# Patient Record
Sex: Female | Born: 1965 | Race: Black or African American | Hispanic: No | Marital: Married | Smoking: Never smoker
Health system: Southern US, Community
[De-identification: ages and names within clinical notes are randomized; demographics above are authoritative.]

## PROBLEM LIST (undated history)

## (undated) DIAGNOSIS — M1990 Unspecified osteoarthritis, unspecified site: Secondary | ICD-10-CM

## (undated) DIAGNOSIS — T7840XA Allergy, unspecified, initial encounter: Secondary | ICD-10-CM

## (undated) DIAGNOSIS — L309 Dermatitis, unspecified: Secondary | ICD-10-CM

## (undated) HISTORY — DX: Unspecified osteoarthritis, unspecified site: M19.90

## (undated) HISTORY — DX: Dermatitis, unspecified: L30.9

## (undated) HISTORY — DX: Allergy, unspecified, initial encounter: T78.40XA

## (undated) HISTORY — PX: TUBAL LIGATION: SHX77

## (undated) HISTORY — PX: KIDNEY DONATION: SHX685

## (undated) HISTORY — PX: ENDOMETRIAL ABLATION: SHX621

---

## 2001-05-18 ENCOUNTER — Other Ambulatory Visit: Admission: RE | Admit: 2001-05-18 | Discharge: 2001-05-18 | Payer: Self-pay | Admitting: *Deleted

## 2001-06-09 ENCOUNTER — Other Ambulatory Visit: Admission: RE | Admit: 2001-06-09 | Discharge: 2001-06-09 | Payer: Self-pay | Admitting: *Deleted

## 2001-06-09 ENCOUNTER — Encounter (INDEPENDENT_AMBULATORY_CARE_PROVIDER_SITE_OTHER): Payer: Self-pay | Admitting: Specialist

## 2001-08-09 ENCOUNTER — Observation Stay (HOSPITAL_COMMUNITY): Admission: RE | Admit: 2001-08-09 | Discharge: 2001-08-10 | Payer: Self-pay | Admitting: *Deleted

## 2001-08-09 ENCOUNTER — Encounter (INDEPENDENT_AMBULATORY_CARE_PROVIDER_SITE_OTHER): Payer: Self-pay

## 2002-05-15 ENCOUNTER — Encounter: Admission: RE | Admit: 2002-05-15 | Discharge: 2002-05-15 | Payer: Self-pay | Admitting: Family Medicine

## 2002-05-15 ENCOUNTER — Encounter: Payer: Self-pay | Admitting: Family Medicine

## 2002-05-24 ENCOUNTER — Other Ambulatory Visit: Admission: RE | Admit: 2002-05-24 | Discharge: 2002-05-24 | Payer: Self-pay | Admitting: *Deleted

## 2004-11-05 ENCOUNTER — Inpatient Hospital Stay (HOSPITAL_COMMUNITY): Admission: AD | Admit: 2004-11-05 | Discharge: 2004-11-05 | Payer: Self-pay | Admitting: Obstetrics & Gynecology

## 2004-11-09 ENCOUNTER — Inpatient Hospital Stay (HOSPITAL_COMMUNITY): Admission: AD | Admit: 2004-11-09 | Discharge: 2004-11-09 | Payer: Self-pay | Admitting: Obstetrics and Gynecology

## 2004-11-11 ENCOUNTER — Inpatient Hospital Stay (HOSPITAL_COMMUNITY): Admission: AD | Admit: 2004-11-11 | Discharge: 2004-11-11 | Payer: Self-pay | Admitting: Obstetrics & Gynecology

## 2004-11-18 ENCOUNTER — Inpatient Hospital Stay (HOSPITAL_COMMUNITY): Admission: AD | Admit: 2004-11-18 | Discharge: 2004-11-18 | Payer: Self-pay | Admitting: Obstetrics and Gynecology

## 2005-11-22 ENCOUNTER — Emergency Department (HOSPITAL_COMMUNITY): Admission: EM | Admit: 2005-11-22 | Discharge: 2005-11-22 | Payer: Self-pay | Admitting: Emergency Medicine

## 2005-12-29 ENCOUNTER — Ambulatory Visit: Payer: Self-pay | Admitting: Obstetrics & Gynecology

## 2006-01-12 ENCOUNTER — Encounter: Payer: Self-pay | Admitting: Obstetrics & Gynecology

## 2006-01-12 ENCOUNTER — Ambulatory Visit: Payer: Self-pay | Admitting: Family Medicine

## 2006-01-28 ENCOUNTER — Inpatient Hospital Stay (HOSPITAL_COMMUNITY): Admission: AD | Admit: 2006-01-28 | Discharge: 2006-01-28 | Payer: Self-pay | Admitting: Obstetrics & Gynecology

## 2006-02-17 ENCOUNTER — Ambulatory Visit: Payer: Self-pay | Admitting: Obstetrics & Gynecology

## 2006-02-22 ENCOUNTER — Ambulatory Visit (HOSPITAL_COMMUNITY): Admission: RE | Admit: 2006-02-22 | Discharge: 2006-02-22 | Payer: Self-pay | Admitting: Obstetrics and Gynecology

## 2006-03-03 ENCOUNTER — Ambulatory Visit: Payer: Self-pay | Admitting: Obstetrics and Gynecology

## 2006-05-02 ENCOUNTER — Ambulatory Visit (HOSPITAL_COMMUNITY): Admission: RE | Admit: 2006-05-02 | Discharge: 2006-05-02 | Payer: Self-pay | Admitting: Obstetrics and Gynecology

## 2006-05-02 ENCOUNTER — Ambulatory Visit: Payer: Self-pay | Admitting: Obstetrics and Gynecology

## 2006-05-25 ENCOUNTER — Ambulatory Visit: Payer: Self-pay | Admitting: Obstetrics & Gynecology

## 2006-05-26 ENCOUNTER — Ambulatory Visit (HOSPITAL_COMMUNITY): Admission: RE | Admit: 2006-05-26 | Discharge: 2006-05-26 | Payer: Self-pay | Admitting: Obstetrics & Gynecology

## 2008-07-11 ENCOUNTER — Emergency Department (HOSPITAL_COMMUNITY): Admission: EM | Admit: 2008-07-11 | Discharge: 2008-07-11 | Payer: Self-pay | Admitting: Emergency Medicine

## 2011-01-08 NOTE — H&P (Signed)
[facility]  Patient:    [name], REDE Visit Number: [id number] MRN: [id number]          Service Type: Attending:  Almedia Balls. Randell Patient, M.D. Dictated by:   Almedia Balls Randell Patient, M.D. Adm. Date:  08/09/01                           History and Physical  CHIEF COMPLAINT:              Fibroids, tubal obstruction by device.  HISTORY OF PRESENT ILLNESS:   The patient is a 45 year old gravida 1, para 1, whose last menstrual period was July 21, 2001.  She has been followed in our office since September 2002.  Because of the heavy flow that she had been experiencing, she underwent ultrasound with saline with finding of increased echogenic areas within the uterus and polypoid areas in the endometrium. She underwent hysteroscopy, D&C, laparoscopy on June 09, 2001, with findings of benign endocervical mucosa and benign endometrium with polyp present.  The laparoscopy revealed large pedunculated fibroids and normal proximal and distal lengths of tubes which had been previously coagulated for sterilization attempt. The patient now has remarried and desires reanastomosis and definitive therapy for her fibroids as well.  She is admitted at this time for exploratory laparotomy and myomectomy and microtubal reanastomosis.  She has been fully counseled as to the nature of this procedure and the risks involved to include risks of anastomosis, injury to uterus, tubes, ovaries, bowel, bladder, blood vessels, ureters, postoperative hemorrhage, infection, and recuperation. She fully understands all these considerations and wishes to proceed on August 09, 2001.  PAST MEDICAL HISTORY:         This includes the previous tubal ligation only as surgery.  She has heavy menstrual flow and some pain for which she takes ibuprofen with only slight relief.  FAMILY HISTORY:               This includes grandfather and mother with diabetes and great-grandmother with questionable breast  cancer.  ALLERGIES:                    No known drug allergies.  MEDICATIONS:                  She takes only the ibuprofen.  REVIEW OF SYSTEMS:            HEENT:  Negative.  Cardiorespiratory:  Negative. Gastrointestinal:  Negative.  Genitourinary:  As in present illness. Neuromuscular:  Negative.  PHYSICAL EXAMINATION:  GENERAL APPEARANCE:           A well-developed black female in no acute distress.  VITAL SIGNS:                  Height 5 feet 6-3/4 inches, weight 144 pounds, blood pressure 114/70, pulse 72, respiratory rate 18.  HEENT:                        Within normal limits.  NECK:                         Supple without masses, adenopathy or bruits.  CARDIOVASCULAR:               Regular rate and rhythm without murmurs.  BREASTS:  Examined sitting and lying without mass.  Axilla negative.  ABDOMEN:                      Flat and soft without mass, nontender.  PELVIC:                       External genitalia, Bartholins, urethra and Skenes glands within normal limits.  Cervix is slightly inflamed. Uterus is mid posterior in position, eight weeks to [redacted] weeks gestational size.  Somewhat irregular without tenderness.  Adnexa exam is without palpable mass and nontender.  Anterior and posterior cul-de-sac exam is confirmatory.  EXTREMITIES:                  Within normal limits.  CENTRAL NERVOUS SYSTEM:       Grossly intact.  SKIN:                         Without suspicious lesion.  IMPRESSION:                   1. Leiomyomata uteri.                               2. Status post tubal ligation.  DISPOSITION:                  As noted above.  NOTE:                         A Pap smear was normal in September of 2002. Dictated by:   Almedia Balls Randell Patient, M.D. Attending:  Almedia Balls. Randell Patient, M.D. DD:  08/07/01 TD:  08/07/01 Job: [id number]

## 2011-01-08 NOTE — Group Therapy Note (Signed)
[name], [name]                 ACCOUNT NO.:  [id number]   MEDICAL RECORD NO.:  [id number]          PATIENT TYPE:  WOC   LOCATION:  [facility]                   FACILITY:  [facility]   PHYSICIAN:  Elsie Lincoln, MD      DATE OF BIRTH:  05/27/1966   DATE OF SERVICE:  12/29/2005                                    CLINIC NOTE   The patient is a 45 year old female who presents to me for the first time  for dysmenorrhea.  The patient is a G3, para 1-0-2-1.  She was last seen in  the [facility] approximately a year ago for an ectopic and had received  methotrexate.  She had taken oral contraceptives in the past for menorrhagia  and dysmenorrhea, and this has been well-controlled.  However, she does not  want to be on those again.  She is looking for something else.  She was  noted to have a submucosal fibroid on ultrasound in 2006.  We talked about  several options, and we think that the Mirena IUD would be good for her.  She understands the risk of this is ectopic pregnancy, PID if exposed to a  sexually transmitted disease, and breakthrough bleeding in the first few  months.   PAST MEDICAL HISTORY:  Denied.   PAST SURGICAL HISTORY:  BTL and then reversal.   OBSTETRICAL HISTORY:  NSVD x1, ectopic x1, miscarriage x1.  __________ 2006.   FAMILY HISTORY:  Diabetes, mother and aunt.  High blood pressure, mother and  aunt.  Cancer, aunt.  Blood clots, grandmother.   SOCIAL HISTORY:  Lives with her husband and son.  She does not smoke or  drink alcohol.  She drinks approximately one caffeinated beverage a week.  She has never been sexually abused.   MEDICATIONS:  Colunix.   MEDICATIONS:  None.   PHYSICAL EXAMINATION:  VITAL SIGNS:  Temperature 97.4, pulse 56, blood  pressure 97/68, weight 160.3, height 5 feet 8 inches.  GENERAL:  Well-nourished, well-developed, in no apparent distress.  ABDOMEN:  Soft, nontender, nondistended.  No rebound, no guarding.  PELVIC:  Genitalia Tanner V.  Vagina:  A  small amount of blood as the  patient is on her menstrual cycle.  Uterus retroverted, mildly tender.  Adnexa:  No masses, nontender.   ASSESSMENT AND PLAN:  A 45 year old female with dysmenorrhea and small  submucosal fibroid on ultrasound.   1.  GC/chlamydia culture sent.  2.  Patient to apply for a Mirena through [employer].  3.  Return to clinic for Mirena IUD.           ______________________________  Elsie Lincoln, MD     KL/MEDQ  D:  12/29/2005  T:  12/30/2005  Job:  [id number]

## 2011-01-08 NOTE — Op Note (Signed)
[name], [name]                 ACCOUNT NO.:  [id number]   MEDICAL RECORD NO.:  [id number]          PATIENT TYPE:  AMB   LOCATION:  [facility]                           [facility]   PHYSICIAN:  Phil D. Okey Dupre, M.D.     DATE OF BIRTH:  12/10/1965   DATE OF PROCEDURE:  05/02/2006  DATE OF DISCHARGE:                                 OPERATIVE REPORT   PROCEDURE:  Hydro-ablation of the endometrium.   PREOPERATIVE DIAGNOSIS:  Intractable menorrhagia.   POSTOPERATIVE DIAGNOSIS:  Intractable menorrhagia.   SURGEON:  Javier Glazier. Okey Dupre, M.D.   ANESTHESIA:  General.   PATHOLOGY SPECIMENS:  None.   POSTOPERATIVE CONDITION:  Satisfactory.   ESTIMATED BLOOD LOSS:  None.   DESCRIPTION OF PROCEDURE:  Under satisfactory general anesthesia with the  patient in dorsal lithotomy position, the perineum and vagina prepped and  draped in the usual sterile manner.  Bimanual pelvic examination under  anesthesia revealed the uterus of normal size, shape, consistency with  normal adnexa and the uterus was in a first degree retroversion.  A weighted  speculum was placed in the posterior fourchette of the vagina through a  marital introitus.  BUS was within normal limits.  The vagina was clean and  well rugated.  The anterior lip of a parous cervix was grasped with a single-  tooth tenaculum.  The uterine cavity was sounded posterior to 10 cm.  The  cervical os easily dilated to #7 Hegar dilator.  The 12 degree hysteroscope  connected to the hydro- ablation equipment was put into the uterus, the  fundus and the uterine cavity examined.  There was a small leiomyomata on  the left lower portion of the uterus just inside of the internal cervical  os.  Both ostia were identified.  Once this was done and the hysteroscope  set with the clip brought into the tenaculum, a 4 x 4 placed behind the  cervix to make sure we could evaluate any leakage of fluid into the vagina  and the Hydro-Ablator started on its heating  cycle according to protocol.  Once the temperature reached 81 degrees, the count down for ablation  started.  The final temperature reached by the Hydro-Ablator was 91 degrees  Celsius.  From the 81 degrees Celsius mark, 10 minutes occurred to the end  of the procedure which went well.  We did frequent checks in the cul-de-sac  to make sure that there was no sign of leakage.  There was an increase in  the amount of fluid to just about 10 mL with the shrinkage of the  aforementioned leiomyomata uteri.  The cool down period started after the 10  minutes of ablating, half for 1  minute and then we removed the hysteroscope from the uterus and vagina and  took the tenaculum off the cervix, observed it for bleeding, none was noted.  The weighted speculum was then removed.  The patient was transferred to  recovery room in satisfactory condition having tolerated the procedure well.           ______________________________  Phil D. Okey Dupre, M.D.     PDR/MEDQ  D:  05/02/2006  T:  05/03/2006  Job:  [id number]

## 2011-01-08 NOTE — Group Therapy Note (Signed)
NAMESHERINA, [name]                 ACCOUNT NO.:  [id number]   MEDICAL RECORD NO.:  [id number]          PATIENT TYPE:  WOC   LOCATION:  [facility]                   FACILITY:  [facility]   PHYSICIAN:  Argentina Donovan, MD        DATE OF BIRTH:  03/21/1966   DATE OF SERVICE:  03/03/2006                                    CLINIC NOTE   The patient is a 45 year old, gravida 3, para 1-0-2-1 who had a spontaneous  vaginal delivery at age 31 and a tubal ligation following that.  She had a  tubal reversal in her early 79s and two miscarriages following that.  She  has had no other surgery.  She has no allergies, and she takes no medicine  on a regular basis.  She has been bothered by extraordinarily heavy periods  and severe dysmenorrhea since she donated a kidney to her mother several  years ago.  She came in desiring an abdominal hysterectomy, and we have  talked to her about alternatives.  We told her we would do that if she would  like, but we would also like to discuss the alternatives.  We talked about  ablation.  She has a small endometrial fibroid, so I think that a  hydroablation probably would have the best chance of success.  We have  discussed that in detail with the possible complications and the significant  possibility of failure, especially with the possibility of the dysmenorrhea.  She thinks she is willing to at least try that.  We are going to schedule  her for an endometrial hydroablation in the near future.   DIAGNOSES:  1.  Dysmenorrhea.  2.  Intractable menorrhagia.  She cannot tolerate oral contraceptives.  3.  A history of tubal ligation reversal and nephrectomy as a donor.           ______________________________  Argentina Donovan, MD     PR/MEDQ  D:  03/03/2006  T:  03/03/2006  Job:  [id number]

## 2011-01-08 NOTE — Discharge Summary (Signed)
[facility]  Patient:    [name], [name] Visit Number: [id number] MRN: [id number]          Service Type: DSU Location: 9300 [id number] Attending Physician:  Collene Schlichter Dictated by:   Almedia Balls. Randell Patient, M.D. Admit Date:  08/09/2001 Discharge Date: 08/10/2001                             Discharge Summary  HISTORY OF PRESENT ILLNESS:   The patient is a 45 year old with leiomyomata uteri and tubal occlusion -- acquired -- for definitive therapy and reversal of tubal obstruction.  The remained of her history and physical are as previously dictated.  LABORATORY/ACCESSORY DATA:    Preoperative hemoglobin 13.1.  HOSPITAL COURSE:              The patient was taken to the operating room on the morning of August 09, 2001, at which time exploratory laparotomy, myomectomy, bilateral isthmic microtubal reanastomosis was performed.  The patient did well postoperatively.  Diet and ambulation were progressed over the evening of August 09, 2001 and the early morning of August 10, 2001. On the morning of August 10, 2001, she was afebrile and experiencing no problems except for pain, which was controlled by pain medication and it was felt that she could be discharged at this time.  FINAL DIAGNOSES:              1. Leiomyomata uteri.                               2. Tubal occlusion -- acquired.  OPERATION:                    1. Exploratory laparotomy.                               2. Myomectomy.                               3. Bilateral isthmic microtubal reanastomosis.  Pathology report unavailable at the time of dictation.  DISPOSITION:                  Discharged home.  To return to the office in two weeks for follow up.  She was instructed to gradually progress her activities over several weeks at home and to limit lifting and driving for two weeks. She was fully ambulatory, on a regular diet and in good condition at the time of discharge.  She  was given a prescription for Dilaudid or generic, 2 mg #30 to be taken 1 or 2 q.4h. p.r.n. pain and doxycycline 100 mg #12 to be taken one b.i.d.  She will call for any problems. Dictated by:   Almedia Balls Randell Patient, M.D. Attending Physician:  Collene Schlichter DD:  08/10/01 TD:  08/10/01 Job: [id number]

## 2011-01-08 NOTE — Op Note (Signed)
[facility]  Patient:    [name], [name] Visit Number: [id number] MRN: [id number]          Service Type: DSU Location: 9300 [id number] Attending Physician:  Collene Schlichter Dictated by:   Almedia Balls. Randell Patient, M.D. Proc. Date: 08/09/01 Admit Date:  08/09/2001   CC:         Harl Bowie, M.D.   Operative Report  PREOPERATIVE DIAGNOSES:       Uterine fibroids, tubal occlusion ______ desires tubal reanastomosis.  POSTOPERATIVE DIAGNOSES:      Uterine fibroids, tubal occlusion ______ desires tubal reanastomosis.  PATHOLOGY:                    Pending.  OPERATION:                    Exploratory laparotomy, bilateral isthmic tubal reanastomosis, myomectomy.  ANESTHESIA:                   General orotracheal.  OPERATOR:                     Almedia Balls. Randell Patient, M.D.  FIRST ASSISTANT:              Harl Bowie, M.D.  INDICATIONS:                  The patient is a 45 year old with the above noted problems who was counseled as to the type of procedure to be performed to accomplish her wishes.  She was fully counseled as to the nature of the procedure and the risks involved to include the risk of anesthesia, injury to uterus, tubes, ovaries, bowel, bladder, blood vessels, ureters, postoperative hemorrhage, infection, and recuperation.  She fully understands all these considerations and wishes to proceed on August 09, 2001.  FINDINGS:                     On entry into the abdomen exploration of the upper abdomen revealed the lower liver edge, gallbladder, spleen, kidneys, periaortic areas, and appendix to be normal to visualization and/or palpation. Uterus was mid posterior with approximately 4 x 3 cm fundal fibroid.  Both tubes had previously been surgically interrupted for sterilization attempt. There was a corpus luteum on the right ovary and both ovaries appeared normal as well.  PROCEDURE:                    With the patient under general  anesthesia, prepared and draped in the usual sterile fashion with a Foley catheter in the bladder, and with a pediatric Foley having been placed in the intrauterine cavity and three 4 x 18 packs placed in the vagina, an incision was made transversely in the lower portion of the abdomen across the pubic area.  This was carried into the peritoneal cavity without difficulty.  Self retaining retractor was placed and the bowel was packed off with wrapped sponges.  After noting that the myoma was pedunculated and that it was the only major myoma present, it was decided to proceed with the tubal reanastomosis initially.  The distal ends of both tubes were cannulated with infant feeding tubes in which #1 ______ had been placed.  The proximal occluded end of each tube was trimmed so that the tubal lumen was identified.  Proximal portions of each tube were also trimmed so that dye was noted to pass easily through the  ostia. The ______ suture was then placed in the proximal ostium of each tube.  Each mesosalpinx was then resected and sutured so that the separated ends of the tubes were brought in approximation.  Interrupted sutures of 8-0 Vicryl were then placed in the muscularis area of the tubes.  This effectively reapproximated the tubal lumens bilaterally.  Interrupted sutures of 6-0 Vicryl were then placed at intervals on the serosa of each tube.  Small bleeders were rendered hemostatic with the bipolar electrocoagulation unit or [facility] unit.  Attention was then directed to the myoma for which the pedicle was transected using Bovie electrocoagulation.  Defect was closed in layers with initially chromic catgut and then superficial layer closed with a baseball suture of 3-0 PDS.  The area had been lavaged with copious amounts of lactated Ringers solution throughout the procedure and after noting that hemostasis was adequate and that all clots were removed and sponge and instrument counts  were correct, the peritoneum was closed with a continuous suture of 0 Vicryl. Fascia was closed with two sutures of 0 Vicryl which were brought from the lateral aspects of the incision and tied separately in the midline. Subcutaneous fat was reapproximated with interrupted sutures of 0 Vicryl. Skin was closed with a subcuticular suture of 3-0 plain catgut.  Estimated blood loss 100 ml.  Patient was taken to the recovery room in good condition with clear urine in the Foley catheter tubing.  The incision had been injected with 10 ml of 0.5% Marcaine with 1:200,000 epinephrine.  The patient will be placed on 23 hour observation following surgery. Dictated by:   Almedia Balls Randell Patient, M.D. Attending Physician:  Collene Schlichter DD:  08/09/01 TD:  08/09/01 Job: [id number]

## 2011-01-08 NOTE — Group Therapy Note (Signed)
[name], WYNES                 ACCOUNT NO.:  [id number]   MEDICAL RECORD NO.:  [id number]          PATIENT TYPE:  WOC   LOCATION:  [facility]                   FACILITY:  [facility]   PHYSICIAN:  Dorthula Perfect, MD     DATE OF BIRTH:  1966/04/14   DATE OF SERVICE:  02/17/2006                                    CLINIC NOTE   This 45 year old African-American female gravida 3, para 1 (19 years ago),  ectopic pregnancy 1 and miscarriage 1, was seen here in May for evaluation  for   Dictation ended at this point.           ______________________________  Dorthula Perfect, MD     ER/MEDQ  D:  02/17/2006  T:  02/17/2006  Job:  [id number]

## 2011-01-08 NOTE — Group Therapy Note (Signed)
NAMEMONIE, SHERE                 ACCOUNT NO.:  [id number]   MEDICAL RECORD NO.:  [id number]          PATIENT TYPE:  WOC   LOCATION:  [facility]                   [facility]   PHYSICIAN:  Elsie Lincoln, MD      DATE OF BIRTH:  Apr 24, 1966   DATE OF SERVICE:                                    CLINIC NOTE   The patient is a 45 year old female who had a Hydrothermal ablation on  April 01, 2006, with Dr. Okey Dupre.  The patient has done well.  She has some  spotting and some clear discharge which is expected after the procedure.  She has not had a period since.  She has not had sex since the procedure,  either.  We talked about that she most likely would not get pregnant, but  this is not a form of birth control.  She was encouraged to at least use  condoms as she does not want to use the hormonal form of birth control.  She  doubts that she will use this, she will take her chances and I, again,  advised her this was not the best method, however, she states she would take  her chances.  She is due for a Pap smear in May and she needs to have a  mammogram which is ordered today.   On physical, vagina is pink, normal rugae, cervix is closed, nontender.   ASSESSMENT/PLAN:  45 year old female status post Hydrothermal ablation doing  well.  Follow up in May for Pap smear.           ______________________________  Elsie Lincoln, MD     KL/MEDQ  D:  05/25/2006  T:  05/26/2006  Job:  [id number]

## 2011-01-08 NOTE — Group Therapy Note (Signed)
[name], [name]                 ACCOUNT NO.:  [id number]   MEDICAL RECORD NO.:  [id number]          PATIENT TYPE:  WOC   LOCATION:  [facility]                   [facility]   PHYSICIAN:  Elsie Lincoln, MD      DATE OF BIRTH:  1965-12-12   DATE OF SERVICE:                                    CLINIC NOTE   REASON FOR VISIT:  IUD placement.   HISTORY OF PRESENT ILLNESS:  This is a 45 year old with abnormal uterine  bleeding, heavy and irregular menses who wants to have an IUD insertion for  contraception and for decreasing uterine bleeding.  She was seen  approximately one month ago, counseled about the possible side effects of  the Mirena, and opted to go forward with placement.  Today, she understands  the risk of ectopic pregnancy, PID if exposed to an STD and breakthrough  bleeding in the first few months.  She also understands that it is possible  for the IUD to be expelled.   PAST MEDICAL HISTORY:  Unremarkable.   PAST SURGICAL HISTORY:  A BTL and then a BTL reversal.   OBSTETRICAL HISTORY:  NSVD x 1, ectopic x 1, miscarriage x 1.   FAMILY HISTORY:  Diabetes in mother and aunt, high blood pressure in mother  and aunt, cancer in aunt.   SOCIAL HISTORY:  Lives with her husband and son.  No tobacco or alcohol.  One caffeinated beverage a week.  Has never been sexually abused, and has a  mutually monogamous relationship.   MEDICATIONS:  Colonix.   ALLERGIES:  None.   PHYSICAL EXAMINATION:  VITAL SIGNS:  Temperature today is 98.1, pulse of 64,  BP of 116/71, weight of 156.8, height of 5 feet 8 inches.  LMP was  12/29/2005.  GENITOURINARY:  Uterus is about 8 weeks' size and retroverted with an  anterior cervix.  External genitalia normal.  Vaginal mucosa normal with  scant discharge.  Uterus is sounded to 8.0 cm.  Tenaculum placed on anterior  cervix.  IUD placed to a depth of 8.0 cm.  No complications with the  procedure.  The strings were cut to approximately 3  inches in length.  Patient tolerated the procedure without any discomfort.  Patient smear was  sent prior to the procedure being done.   ASSESSMENT AND PLAN:  A 45 year old with dysmenorrhea, small mucosal  separate fibroid on ultrasound who had an intrauterine device placed today.  She will follow up in 6 weeks for a string recheck.   DICTATED BY:  Montey Hora.     ______________________________  Elsie Lincoln, MD    ______________________________  Elsie Lincoln, MD    KL/MEDQ  D:  01/12/2006  T:  01/12/2006  Job:  [id number]

## 2011-05-26 LAB — URINALYSIS, ROUTINE W REFLEX MICROSCOPIC
Bilirubin Urine: NEGATIVE
Glucose, UA: NEGATIVE
Hgb urine dipstick: NEGATIVE
Nitrite: NEGATIVE
Specific Gravity, Urine: 1.014
Urobilinogen, UA: 0.2
pH: 7

## 2011-05-26 LAB — CBC
HCT: 39.8
MCV: 94.9
RBC: 4.19
WBC: 4

## 2011-05-26 LAB — CK TOTAL AND CKMB (NOT AT ARMC): Total CK: 196 — ABNORMAL HIGH

## 2011-05-26 LAB — COMPREHENSIVE METABOLIC PANEL
Alkaline Phosphatase: 59
Calcium: 9.7
Glucose, Bld: 93
Total Protein: 7

## 2011-05-26 LAB — DIFFERENTIAL
Basophils Absolute: 0
Monocytes Absolute: 0.4

## 2011-05-26 LAB — TROPONIN I: Troponin I: 0.01

## 2013-08-30 ENCOUNTER — Encounter: Payer: Self-pay | Admitting: Gastroenterology

## 2013-09-19 ENCOUNTER — Encounter: Payer: Self-pay | Admitting: Gastroenterology

## 2013-09-19 ENCOUNTER — Other Ambulatory Visit (INDEPENDENT_AMBULATORY_CARE_PROVIDER_SITE_OTHER): Payer: BC Managed Care – PPO

## 2013-09-19 ENCOUNTER — Ambulatory Visit (INDEPENDENT_AMBULATORY_CARE_PROVIDER_SITE_OTHER): Payer: BC Managed Care – PPO | Admitting: Gastroenterology

## 2013-09-19 VITALS — BP 100/66 | HR 60 | Ht 67.75 in | Wt 160.4 lb

## 2013-09-19 DIAGNOSIS — R1012 Left upper quadrant pain: Secondary | ICD-10-CM

## 2013-09-19 LAB — HEPATIC FUNCTION PANEL
ALBUMIN: 4.2 g/dL (ref 3.5–5.2)
ALK PHOS: 58 U/L (ref 39–117)
ALT: 19 U/L (ref 0–35)
AST: 23 U/L (ref 0–37)
BILIRUBIN TOTAL: 0.9 mg/dL (ref 0.3–1.2)
Bilirubin, Direct: 0.1 mg/dL (ref 0.0–0.3)
Total Protein: 7.6 g/dL (ref 6.0–8.3)

## 2013-09-19 LAB — BASIC METABOLIC PANEL
BUN: 12 mg/dL (ref 6–23)
CO2: 29 mEq/L (ref 19–32)
Calcium: 9.5 mg/dL (ref 8.4–10.5)
Chloride: 102 mEq/L (ref 96–112)
Creatinine, Ser: 1 mg/dL (ref 0.4–1.2)
GFR: 76.76 mL/min (ref >60.00)
Glucose, Bld: 68 mg/dL — ABNORMAL LOW (ref 70–99)
Potassium: 4.3 mEq/L (ref 3.5–5.1)
Sodium: 137 mEq/L (ref 135–145)

## 2013-09-19 LAB — CBC WITH DIFFERENTIAL/PLATELET
BASOS ABS: 0 10*3/uL (ref 0.0–0.1)
BASOS PCT: 0.7 % (ref 0.0–3.0)
Eosinophils Absolute: 0.1 10*3/uL (ref 0.0–0.7)
Eosinophils Relative: 4.1 % (ref 0.0–5.0)
HEMATOCRIT: 43 % (ref 36.0–46.0)
HEMOGLOBIN: 13.9 g/dL (ref 12.0–15.0)
Lymphocytes Relative: 52.6 % — ABNORMAL HIGH (ref 12.0–46.0)
Lymphs Abs: 1.6 10*3/uL (ref 0.7–4.0)
MCHC: 32.5 g/dL (ref 30.0–36.0)
MCV: 96 fl (ref 78.0–100.0)
MONO ABS: 0.4 10*3/uL (ref 0.1–1.0)
Monocytes Relative: 11.6 % (ref 3.0–12.0)
NEUTROS ABS: 0.9 10*3/uL — AB (ref 1.4–7.7)
Neutrophils Relative %: 31 % — ABNORMAL LOW (ref 43.0–77.0)
PLATELETS: 245 10*3/uL (ref 150.0–400.0)
RBC: 4.48 Mil/uL (ref 3.87–5.11)
RDW: 13.2 % (ref 11.5–14.6)
WBC: 3 10*3/uL — AB (ref 4.5–10.5)

## 2013-09-19 LAB — TSH: TSH: 0.95 u[IU]/mL (ref 0.35–5.50)

## 2013-09-19 MED ORDER — OMEPRAZOLE 40 MG PO CPDR: 40.0000 mg | DELAYED_RELEASE_CAPSULE | Freq: Every day | ORAL | Status: DC

## 2013-09-19 NOTE — Patient Instructions (Signed)
We have sent the following medications to your pharmacy for you to pick up at your convenience:Omeprazole.  Your physician has requested that you go to the basement for the following lab work before leaving today: [facility]  You have been scheduled for an endoscopy with propofol. Please follow written instructions given to you at your visit today. If you use inhalers (even only as needed), please bring them with you on the day of your procedure.  Please follow instructions on Hemoccult cards and mail them back to [place] when finished.   Thank you for choosing me and [facility].  Pricilla Riffle. Dagoberto Ligas., MD., Marval Regal  cc: Teressa Lower, MD

## 2013-09-19 NOTE — Progress Notes (Signed)
    History of Present Illness: This is a 48 year old female who relates left upper quadrant pain beginning in August. She feels bloated and has ongoing problems with gas. She states she has had mild constipation on occasion but not recently. She notes her pain increases with stress. Her symptoms do not change with meals, bowel movements or time of day. Operative notes from [facility] in [place] were reviewed.  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 Denies weight loss, diarrhea, change in stool caliber, melena, hematochezia, nausea, vomiting, dysphagia, reflux symptoms, chest pain.  Review of Systems: Pertinent positive and negative review of systems were noted in the above HPI section. All other review of systems were otherwise negative.  Current Medications, Allergies, Past Medical History, Past Surgical History, Family History and Social History were reviewed in [employer] record.  Physical Exam: General: Well developed , well nourished, no acute distress Head: Normocephalic and atraumatic Eyes:  sclerae anicteric, EOMI Ears: Normal auditory acuity Mouth: No deformity or lesions Neck: Supple, no masses or thyromegaly Lungs: Clear throughout to auscultation Heart: Regular rate and rhythm; no murmurs, rubs or bruits, mild left costocondral margin tenderness Abdomen: Soft, mild left upper quadrant tenderness without rebound or guarding and non distended. No masses, hepatosplenomegaly or hernias noted. Normal Bowel sounds Musculoskeletal: Symmetrical with no gross deformities  Skin: No lesions on visible extremities Pulses:  Normal pulses noted Extremities: No clubbing, cyanosis, edema or deformities noted Neurological: Alert oriented x 4, grossly nonfocal Cervical Nodes:  No significant  cervical adenopathy Inguinal Nodes: No significant inguinal adenopathy Psychological:  Alert and cooperative. Normal mood and affect  Assessment and Recommendations:  1. LUQ pain, left costal margin tenderness and bloating. R/O ulcer, gastritis, musculoskeletal. Omeprazole 40 mg po daily. Obtain blood work and stool Hemoccults. Schedule EGD. The risks, benefits, and alternatives to endoscopy with possible biopsy and possible dilation were discussed with the patient and they consent to proceed.

## 2013-09-21 ENCOUNTER — Encounter: Payer: Self-pay | Admitting: Gastroenterology

## 2013-09-24 ENCOUNTER — Telehealth: Payer: Self-pay | Admitting: Gastroenterology

## 2013-09-24 NOTE — Telephone Encounter (Signed)
Spoke with pt, see result note. Labs sent to pts PCP.

## 2013-10-02 ENCOUNTER — Telehealth: Payer: Self-pay

## 2013-10-02 ENCOUNTER — Ambulatory Visit (AMBULATORY_SURGERY_CENTER): Payer: BC Managed Care – PPO | Admitting: Gastroenterology

## 2013-10-02 ENCOUNTER — Encounter: Payer: Self-pay | Admitting: Gastroenterology

## 2013-10-02 VITALS — BP 100/66 | HR 51 | Temp 98.0°F | Resp 20 | Ht 67.0 in | Wt 160.0 lb

## 2013-10-02 DIAGNOSIS — R1012 Left upper quadrant pain: Secondary | ICD-10-CM

## 2013-10-02 DIAGNOSIS — R079 Chest pain, unspecified: Secondary | ICD-10-CM

## 2013-10-02 MED ORDER — SODIUM CHLORIDE 0.9 % IV SOLN: 500.0000 mL | INTRAVENOUS | Status: DC

## 2013-10-02 NOTE — Patient Instructions (Signed)
Take Advil 400 mg by mouth three times daily for 7-10 days.  Dr. Lynne Leader office will arrange an Abdominal Ultrasound.  Follow up with Dr. Garlon Hatchet.    YOU HAD AN ENDOSCOPIC PROCEDURE TODAY AT [facility]: Refer to the procedure report that was given to you for any specific questions about what was found during the examination.  If the procedure report does not answer your questions, please call your gastroenterologist to clarify.  If you requested that your care partner not be given the details of your procedure findings, then the procedure report has been included in a sealed envelope for you to review at your convenience later.  YOU SHOULD EXPECT: Some feelings of bloating in the abdomen. Passage of more gas than usual.  Walking can help get rid of the air that was put into your GI tract during the procedure and reduce the bloating. If you had a lower endoscopy (such as a colonoscopy or flexible sigmoidoscopy) you may notice spotting of blood in your stool or on the toilet paper. If you underwent a bowel prep for your procedure, then you may not have a normal bowel movement for a few days.  DIET: Your first meal following the procedure should be a light meal and then it is ok to progress to your normal diet.  A half-sandwich or bowl of soup is an example of a good first meal.  Heavy or fried foods are harder to digest and may make you feel nauseous or bloated.  Likewise meals heavy in dairy and vegetables can cause extra gas to form and this can also increase the bloating.  Drink plenty of fluids but you should avoid alcoholic beverages for 24 hours.  ACTIVITY: Your care partner should take you home directly after the procedure.  You should plan to take it easy, moving slowly for the rest of the day.  You can resume normal activity the day after the procedure however you should NOT DRIVE or use heavy machinery for 24 hours (because of the sedation medicines used during the test).     SYMPTOMS TO REPORT IMMEDIATELY: A gastroenterologist can be reached at any hour.  During normal business hours, 8:30 AM to 5:00 PM Monday through Friday, call [phone].  After hours and on weekends, please call the GI answering service at [phone] who will take a message and have the physician on call contact you.   Following upper endoscopy (EGD)  Vomiting of blood or coffee ground material  New chest pain or pain under the shoulder blades  Painful or persistently difficult swallowing  New shortness of breath  Fever of 100F or higher  Black, tarry-looking stools  FOLLOW UP: If any biopsies were taken you will be contacted by phone or by letter within the next 1-3 weeks.  Call your gastroenterologist if you have not heard about the biopsies in 3 weeks.  Our staff will call the home number listed on your records the next business day following your procedure to check on you and address any questions or concerns that you may have at that time regarding the information given to you following your procedure. This is a courtesy call and so if there is no answer at the home number and we have not heard from you through the emergency physician on call, we will assume that you have returned to your regular daily activities without incident.  SIGNATURES/CONFIDENTIALITY: You and/or your care partner have signed paperwork which will be entered into  your electronic medical record.  These signatures attest to the fact that that the information above on your After Visit Summary has been reviewed and is understood.  Full responsibility of the confidentiality of this discharge information lies with you and/or your care-partner.

## 2013-10-02 NOTE — Op Note (Signed)
[place]  [address] [place], [id number]   ENDOSCOPY PROCEDURE REPORT  PATIENT: Jill, Dalton  MR#: [id number] BIRTHDATE: [birth year] , 47  yrs. old GENDER: Female ENDOSCOPIST: Ladene Artist, MD, Marval Regal REFERRED BY:  Lilian Coma, M.D. PROCEDURE DATE:  10/02/2013 PROCEDURE:  EGD, diagnostic ASA CLASS:     Class II INDICATIONS:  abdominal pain in upper left quadrant.   Chest pain. MEDICATIONS: MAC sedation, administered by CRNA and propofol (Diprivan) 200mg  IV TOPICAL ANESTHETIC: Cetacaine Spray DESCRIPTION OF PROCEDURE: After the risks benefits and alternatives of the procedure were thoroughly explained, informed consent was obtained.  The LB [id number] endoscope was introduced through the mouth and advanced to the second portion of the duodenum. Without limitations.  The instrument was slowly withdrawn as the mucosa was fully examined.  ESOPHAGUS: The mucosa of the esophagus appeared normal. STOMACH: The mucosa and folds of the stomach appeared normal. DUODENUM: The duodenal mucosa showed no abnormalities in the bulb and second portion of the duodenum.  Retroflexed views revealed no abnormalities.   The scope was then withdrawn from the patient and the procedure completed.  COMPLICATIONS: There were no complications.  ENDOSCOPIC IMPRESSION: 1.   The EGD appeared normal  RECOMMENDATIONS: 1. My office will arrange for you to have an abdominal ultrasound performed. 2. Advil 400 mg po tid for 7-10 days and then as needed 3. Follow up with Dr. Garlon Hatchet   eSigned:  Ladene Artist, MD, [facility] 10/02/2013 11:37 AM

## 2013-10-02 NOTE — Telephone Encounter (Signed)
Per EGD procedure report patient needs an abdominal US.  She is scheduled for 10/04/13 8:00 at [facility].  She is advised to arrive at 7:45 and be NPO after midnight

## 2013-10-03 ENCOUNTER — Telehealth: Payer: Self-pay | Admitting: *Deleted

## 2013-10-03 NOTE — Telephone Encounter (Signed)
  Follow up Call-  Call back number 10/02/2013  Post procedure Call Back phone  # [phone]  Permission to leave phone message Yes     Patient questions:  Do you have a fever, pain , or abdominal swelling? no Pain Score  0 *  Have you tolerated food without any problems? yes  Have you been able to return to your normal activities? yes  Do you have any questions about your discharge instructions: Diet   no Medications  no Follow up visit  no  Do you have questions or concerns about your Care? no  Actions: * If pain score is 4 or above: No action needed, pain <4.

## 2013-10-04 ENCOUNTER — Telehealth: Payer: Self-pay | Admitting: Gastroenterology

## 2013-10-04 ENCOUNTER — Ambulatory Visit (HOSPITAL_COMMUNITY)
Admission: RE | Admit: 2013-10-04 | Discharge: 2013-10-04 | Disposition: A | Discharge status: Home / Self Care | Payer: BC Managed Care – PPO | Source: Ambulatory Visit | Attending: Gastroenterology | Admitting: Gastroenterology

## 2013-10-04 DIAGNOSIS — R1012 Left upper quadrant pain: Secondary | ICD-10-CM

## 2013-10-04 DIAGNOSIS — Z905 Acquired absence of kidney: Secondary | ICD-10-CM | POA: Insufficient documentation

## 2013-10-04 NOTE — Telephone Encounter (Signed)
Patient notified of the results of the [place] and Dr. Lynne Leader recommendations.

## 2013-10-26 ENCOUNTER — Ambulatory Visit: Payer: BC Managed Care – PPO | Admitting: Physician Assistant

## 2013-11-02 ENCOUNTER — Other Ambulatory Visit (INDEPENDENT_AMBULATORY_CARE_PROVIDER_SITE_OTHER): Payer: BC Managed Care – PPO

## 2013-11-02 ENCOUNTER — Encounter: Payer: Self-pay | Admitting: Physician Assistant

## 2013-11-02 ENCOUNTER — Ambulatory Visit (INDEPENDENT_AMBULATORY_CARE_PROVIDER_SITE_OTHER): Payer: BC Managed Care – PPO | Admitting: Physician Assistant

## 2013-11-02 VITALS — BP 100/70 | HR 55 | Temp 98.0°F | Resp 14 | Ht 67.0 in | Wt 159.4 lb

## 2013-11-02 DIAGNOSIS — R5383 Other fatigue: Secondary | ICD-10-CM

## 2013-11-02 DIAGNOSIS — M549 Dorsalgia, unspecified: Secondary | ICD-10-CM

## 2013-11-02 DIAGNOSIS — Z0000 Encounter for general adult medical examination without abnormal findings: Secondary | ICD-10-CM

## 2013-11-02 DIAGNOSIS — R5381 Other malaise: Secondary | ICD-10-CM

## 2013-11-02 DIAGNOSIS — Z23 Encounter for immunization: Secondary | ICD-10-CM

## 2013-11-02 LAB — LIPID PANEL
Cholesterol: 176 mg/dL (ref 0–200)
HDL: 73.5 mg/dL (ref >39.00)
LDL CALC: 98 mg/dL (ref 0–99)
TRIGLYCERIDES: 24 mg/dL (ref 0.0–149.0)
Total CHOL/HDL Ratio: 2
VLDL: 4.8 mg/dL (ref 0.0–40.0)

## 2013-11-02 LAB — URINALYSIS, ROUTINE W REFLEX MICROSCOPIC
Bilirubin Urine: NEGATIVE
KETONES UR: NEGATIVE
LEUKOCYTES UA: NEGATIVE
Nitrite: NEGATIVE
Specific Gravity, Urine: 1.025 (ref 1.000–1.030)
Total Protein, Urine: NEGATIVE
UROBILINOGEN UA: 0.2 (ref 0.0–1.0)
Urine Glucose: NEGATIVE
pH: 6 (ref 5.0–8.0)

## 2013-11-02 NOTE — Patient Instructions (Signed)
It was great to meet you today [name]!   Labs have been ordered for you, when you report to lab please be fasting.    Fatigue Fatigue is a feeling of tiredness, lack of energy, lack of motivation, or feeling tired all the time. Having enough rest, good nutrition, and reducing stress will normally reduce fatigue. Consult your caregiver if it persists. The nature of your fatigue will help your caregiver to find out its cause. The treatment is based on the cause.  CAUSES  There are many causes for fatigue. Most of the time, fatigue can be traced to one or more of your habits or routines. Most causes fit into one or more of three general areas. They are: Lifestyle problems  Sleep disturbances.  Overwork.  Physical exertion.  Unhealthy habits.  Poor eating habits or eating disorders.  Alcohol and/or drug use .  Lack of proper nutrition (malnutrition). Psychological problems  Stress and/or anxiety problems.  Depression.  Grief.  Boredom. Medical Problems or Conditions  Anemia.  Pregnancy.  Thyroid gland problems.  Recovery from major surgery.  Continuous pain.  Emphysema or asthma that is not well controlled  Allergic conditions.  Diabetes.  Infections (such as mononucleosis).  Obesity.  Sleep disorders, such as sleep apnea.  Heart failure or other heart-related problems.  Cancer.  Kidney disease.  Liver disease.  Effects of certain medicines such as antihistamines, cough and cold remedies, prescription pain medicines, heart and blood pressure medicines, drugs used for treatment of cancer, and some antidepressants. SYMPTOMS  The symptoms of fatigue include:   Lack of energy.  Lack of drive (motivation).  Drowsiness.  Feeling of indifference to the surroundings. DIAGNOSIS  The details of how you feel help guide your caregiver in finding out what is causing the fatigue. You will be asked about your present and past health condition. It is  important to review all medicines that you take, including prescription and non-prescription items. A thorough exam will be done. You will be questioned about your feelings, habits, and normal lifestyle. Your caregiver may suggest blood tests, urine tests, or other tests to look for common medical causes of fatigue.  TREATMENT  Fatigue is treated by correcting the underlying cause. For example, if you have continuous pain or depression, treating these causes will improve how you feel. Similarly, adjusting the dose of certain medicines will help in reducing fatigue.  HOME CARE INSTRUCTIONS   Try to get the required amount of good sleep every night.  Eat a healthy and nutritious diet, and drink enough water throughout the day.  Practice ways of relaxing (including yoga or meditation).  Exercise regularly.  Make plans to change situations that cause stress. Act on those plans so that stresses decrease over time. Keep your work and personal routine reasonable.  Avoid street drugs and minimize use of alcohol.  Start taking a daily multivitamin after consulting your caregiver. SEEK MEDICAL CARE IF:   You have persistent tiredness, which cannot be accounted for.  You have fever.  You have unintentional weight loss.  You have headaches.  You have disturbed sleep throughout the night.  You are feeling sad.  You have constipation.  You have dry skin.  You have gained weight.  You are taking any new or different medicines that you suspect are causing fatigue.  You are unable to sleep at night.  You develop any unusual swelling of your legs or other parts of your body. SEEK IMMEDIATE MEDICAL CARE IF:  You are feeling confused.  Your vision is blurred.  You feel faint or pass out.  You develop severe headache.  You develop severe abdominal, pelvic, or back pain.  You develop chest pain, shortness of breath, or an irregular or fast heartbeat.  You are unable to pass a  normal amount of urine.  You develop abnormal bleeding such as bleeding from the rectum or you vomit blood.  You have thoughts about harming yourself or committing suicide.  You are worried that you might harm someone else. MAKE SURE YOU:   Understand these instructions.  Will watch your condition.  Will get help right away if you are not doing well or get worse. Document Released: 06/06/2007 Document Revised: 11/01/2011 Document Reviewed: 06/06/2007 [facility] Patient Information 2014 [place].   Health Maintenance, Female A healthy lifestyle and preventative care can promote health and wellness.  Maintain regular health, dental, and eye exams.  Eat a healthy diet. Foods like vegetables, fruits, whole grains, low-fat dairy products, and lean protein foods contain the nutrients you need without too many calories. Decrease your intake of foods high in solid fats, added sugars, and salt. Get information about a proper diet from your caregiver, if necessary.  Regular physical exercise is one of the most important things you can do for your health. Most adults should get at least 150 minutes of moderate-intensity exercise (any activity that increases your heart rate and causes you to sweat) each week. In addition, most adults need muscle-strengthening exercises on 2 or more days a week.   Maintain a healthy weight. The body mass index (BMI) is a screening tool to identify possible weight problems. It provides an estimate of body fat based on height and weight. Your caregiver can help determine your BMI, and can help you achieve or maintain a healthy weight. For adults 20 years and older:  A BMI below 18.5 is considered underweight.  A BMI of 18.5 to 24.9 is normal.  A BMI of 25 to 29.9 is considered overweight.  A BMI of 30 and above is considered obese.  Maintain normal blood lipids and cholesterol by exercising and minimizing your intake of saturated fat. Eat a balanced diet  with plenty of fruits and vegetables. Blood tests for lipids and cholesterol should begin at age 16 and be repeated every 5 years. If your lipid or cholesterol levels are high, you are over 50, or you are a high risk for heart disease, you may need your cholesterol levels checked more frequently.Ongoing high lipid and cholesterol levels should be treated with medicines if diet and exercise are not effective.  If you smoke, find out from your caregiver how to quit. If you do not use tobacco, do not start.  Lung cancer screening is recommended for adults aged 10 80 years who are at high risk for developing lung cancer because of a history of smoking. Yearly low-dose computed tomography (CT) is recommended for people who have at least a 30-pack-year history of smoking and are a current smoker or have quit within the past 15 years. A pack year of smoking is smoking an average of 1 pack of cigarettes a day for 1 year (for example: 1 pack a day for 30 years or 2 packs a day for 15 years). Yearly screening should continue until the smoker has stopped smoking for at least 15 years. Yearly screening should also be stopped for people who develop a health problem that would prevent them from having lung cancer treatment.  If you are pregnant, do not drink alcohol. If you are breastfeeding, be very cautious about drinking alcohol. If you are not pregnant and choose to drink alcohol, do not exceed 1 drink per day. One drink is considered to be 12 ounces (355 mL) of beer, 5 ounces (148 mL) of wine, or 1.5 ounces (44 mL) of liquor.  Avoid use of street drugs. Do not share needles with anyone. Ask for help if you need support or instructions about stopping the use of drugs.  High blood pressure causes heart disease and increases the risk of stroke. Blood pressure should be checked at least every 1 to 2 years. Ongoing high blood pressure should be treated with medicines, if weight loss and exercise are not  effective.  If you are 47 to 49 years old, ask your caregiver if you should take aspirin to prevent strokes.  Diabetes screening involves taking a blood sample to check your fasting blood sugar level. This should be done once every 3 years, after age 87, if you are within normal weight and without risk factors for diabetes. Testing should be considered at a younger age or be carried out more frequently if you are overweight and have at least 1 risk factor for diabetes.  Breast cancer screening is essential preventative care for women. You should practice "breast self-awareness." This means understanding the normal appearance and feel of your breasts and may include breast self-examination. Any changes detected, no matter how small, should be reported to a caregiver. Women in their 35s and 30s should have a clinical breast exam (CBE) by a caregiver as part of a regular health exam every 1 to 3 years. After age 14, women should have a CBE every year. Starting at age 2, women should consider having a mammogram (breast X-ray) every year. Women who have a family history of breast cancer should talk to their caregiver about genetic screening. Women at a high risk of breast cancer should talk to their caregiver about having an MRI and a mammogram every year.  Breast cancer gene (BRCA)-related cancer risk assessment is recommended for women who have family members with BRCA-related cancers. BRCA-related cancers include breast, ovarian, tubal, and peritoneal cancers. Having family members with these cancers may be associated with an increased risk for harmful changes (mutations) in the breast cancer genes BRCA1 and BRCA2. Results of the assessment will determine the need for genetic counseling and BRCA1 and BRCA2 testing.  The Pap test is a screening test for cervical cancer. Women should have a Pap test starting at age 55. Between ages 81 and 38, Pap tests should be repeated every 2 years. Beginning at age 42,  you should have a Pap test every 3 years as long as the past 3 Pap tests have been normal. If you had a hysterectomy for a problem that was not cancer or a condition that could lead to cancer, then you no longer need Pap tests. If you are between ages 47 and 82, and you have had normal Pap tests going back 10 years, you no longer need Pap tests. If you have had past treatment for cervical cancer or a condition that could lead to cancer, you need Pap tests and screening for cancer for at least 20 years after your treatment. If Pap tests have been discontinued, risk factors (such as a new sexual partner) need to be reassessed to determine if screening should be resumed. Some women have medical problems that increase the chance of getting cervical  cancer. In these cases, your caregiver may recommend more frequent screening and Pap tests.  The human papillomavirus (HPV) test is an additional test that may be used for cervical cancer screening. The HPV test looks for the virus that can cause the cell changes on the cervix. The cells collected during the Pap test can be tested for HPV. The HPV test could be used to screen women aged 59 years and older, and should be used in women of any age who have unclear Pap test results. After the age of 2, women should have HPV testing at the same frequency as a Pap test.  Colorectal cancer can be detected and often prevented. Most routine colorectal cancer screening begins at the age of 85 and continues through age 65. However, your caregiver may recommend screening at an earlier age if you have risk factors for colon cancer. On a yearly basis, your caregiver may provide home test kits to check for hidden blood in the stool. Use of a small camera at the end of a tube, to directly examine the colon (sigmoidoscopy or colonoscopy), can detect the earliest forms of colorectal cancer. Talk to your caregiver about this at age 89, when routine screening begins. Direct examination of  the colon should be repeated every 5 to 10 years through age 47, unless early forms of pre-cancerous polyps or small growths are found.  Hepatitis C blood testing is recommended for all people born from [birth year] through 1966 and any individual with known risks for hepatitis C.  Practice safe sex. Use condoms and avoid high-risk sexual practices to reduce the spread of sexually transmitted infections (STIs). Sexually active women aged 17 and younger should be checked for Chlamydia, which is a common sexually transmitted infection. Older women with new or multiple partners should also be tested for Chlamydia. Testing for other STIs is recommended if you are sexually active and at increased risk.  Osteoporosis is a disease in which the bones lose minerals and strength with aging. This can result in serious bone fractures. The risk of osteoporosis can be identified using a bone density scan. Women ages 52 and over and women at risk for fractures or osteoporosis should discuss screening with their caregivers. Ask your caregiver whether you should be taking a calcium supplement or vitamin D to reduce the rate of osteoporosis.  Menopause can be associated with physical symptoms and risks. Hormone replacement therapy is available to decrease symptoms and risks. You should talk to your caregiver about whether hormone replacement therapy is right for you.  Use sunscreen. Apply sunscreen liberally and repeatedly throughout the day. You should seek shade when your shadow is shorter than you. Protect yourself by wearing long sleeves, pants, a wide-brimmed hat, and sunglasses year round, whenever you are outdoors.  Notify your caregiver of new moles or changes in moles, especially if there is a change in shape or color. Also notify your caregiver if a mole is larger than the size of a pencil eraser.  Stay current with your immunizations. Document Released: 02/22/2011 Document Revised: 12/04/2012 Document Reviewed:  02/22/2011 [facility] Patient Information 2014 [place].

## 2013-11-02 NOTE — Progress Notes (Signed)
Patient ID: [name] is a 48 y.o. female DOB: [id number] MRN: [id number]     HPI:  Patient is a 48 year old female who presents to establish care. Is married with one one adult child. Has donated one kidney to her mother.  Reports history of chronic back pain due to arthritis and degenerative joint disease, follows with [facility], uses an Advil gel cap on occasion for pain relief. As upper abdominal pain occasionally, has been evaluated by Dr. Johny Shock who instructed her to use Omeprazole which she used for one week and states it did nothing for her so she does not take it any longer. Has had an endoscopy which was negative. Has yearly gynecology exam with GYN provider. Reports is generally in good health, does not like to take a lot of medications so tries to take good care of herself. Reports does feel fatigue off and on. Denies chest pain/palpitations, SOB, cough, extremity swelling, lightheaded, dizziness, weakness, heat/cold intolerance, visual change or disturbances, change in bowel/bladder habits.   Influenza: did not get this year Tetanus: unknown PAP: 1/14 LMP:10/31/13 Mammogram: 1/14 Eye Dr. Maryjane Hurter seen in 1/15, waiting to decide if getting glasses or contacts Dentist not in a while   ROS: As stated in HPI. All other systems negative  Past Medical History  Diagnosis Date  . Arthritis     BACK   Family History  Problem Relation Age of Onset  . Breast cancer Mother   . Diabetes Mother   . Breast cancer Maternal Aunt   . Diabetes Father   . Diabetes Maternal Grandfather    History   Social History  . Marital Status: Single    Spouse Name: N/A    Number of Children: 1  . Years of Education: N/A   Occupational History  . Bondsman   . Lifestyle Model    Social History Main Topics  . Smoking status: Never Smoker   . Smokeless tobacco: Never Used  . Alcohol Use: No  . Drug Use: No  . Sexual Activity: None   Other Topics Concern  . None   Social History  Narrative   Employed   Attended business college   Occupation - bondsman, model   8 hours of sleep a night   3 residing in the home   Past Surgical History  Procedure Laterality Date  . Endometrial ablation    . Tubal ligation    . Tubal ligation      Reversal  . Kidney donation      2007   No current outpatient prescriptions on file prior to visit.   No current facility-administered medications on file prior to visit.   No Known Allergies  PE:  Filed Vitals:   11/02/13 1003  BP: 100/70  Pulse: 55  Temp: 98 F (36.7 C)  Resp: 14    CONSTITUTIONAL: Well developed, well nourished, pleasant, appears stated age, in NAD HEENT: normocephalic, atraumatic, bilateral ext/int canals normal. Bilateral TM's without injections, bulging, erythema. Nose normal, uvula midline, oropharynx clear and moist. EYES: PERRLA, bilateral EOM and conjunctiva normal, no icterus NECK: FROM, supple, without thyromegaly or mass CARDIO: RRR, normal S1 and S2, distal pulses intact., no extremity swelling PULM/CHEST CTA bilateral, no wheezes, rales or rhonchi. Non tender. ABD: appearance normal, soft, nontender. Normal bowel sounds x 4 quadrants, non palpable kidney, liver, spleen GU: deferred to GYN MUSC: FROM U/LE bilateral, FROM thoracic and lumbar spine, no midline tenderness noted. LYMPH: no cervical, supraclavicular adenopathy  NEURO: alert and oriented x 3, no cranial nerve deficit, motor strength and coordination NL. DTR's intact. Negative romberg. Gait normal. SKIN: warm, dry, no rash or lesions noted. PSYCH: Mood and affect normal, speech normal.   Lab Results  Component Value Date   WBC 3.0* 09/19/2013   HGB 13.9 09/19/2013   HCT 43.0 09/19/2013   PLT 245.0 09/19/2013   GLUCOSE 68* 09/19/2013   ALT 19 09/19/2013   AST 23 09/19/2013   NA 137 09/19/2013   K 4.3 09/19/2013   CL 102 09/19/2013   CREATININE 1.0 09/19/2013   BUN 12 09/19/2013   CO2 29 09/19/2013   TSH 0.95 09/19/2013      ASSESSMENT and PLAN   CPX/v70.0 - Patient has been counseled on age-appropriate routine health concerns for screening and prevention. These are reviewed and up-to-date. Immunizations are up-to-date or declined. Labs ordered and will be reviewed.  Tdap updated today  Back pain, chronic Continue follow up with [facility]  Fatigue:  Lab for Vitamin D today

## 2013-11-02 NOTE — Progress Notes (Signed)
Pre visit review using our clinic review tool, if applicable. No additional management support is needed unless otherwise documented below in the visit note. 

## 2013-11-03 LAB — VITAMIN D 25 HYDROXY (VIT D DEFICIENCY, FRACTURES): Vit D, 25-Hydroxy: 59 ng/mL (ref 30–89)

## 2014-01-02 ENCOUNTER — Ambulatory Visit (INDEPENDENT_AMBULATORY_CARE_PROVIDER_SITE_OTHER): Payer: BC Managed Care – PPO | Admitting: Podiatry

## 2014-01-02 ENCOUNTER — Encounter: Payer: Self-pay | Admitting: Podiatry

## 2014-01-02 VITALS — BP 113/74 | HR 52 | Resp 16

## 2014-01-02 DIAGNOSIS — L259 Unspecified contact dermatitis, unspecified cause: Secondary | ICD-10-CM

## 2014-01-02 DIAGNOSIS — M2040 Other hammer toe(s) (acquired), unspecified foot: Secondary | ICD-10-CM

## 2014-01-02 NOTE — Progress Notes (Signed)
   Subjective:    Patient ID: [name], female    DOB: [birth year], 48 y.o.   MRN: [id number]  HPI Comments: "I have all this stuff on my feet"  Patient c/o discoloration medial sides of both feet for several years. Notices dark splotches and white patches of skin. Feels rough. She has seen podiatrist and dermatologist. They said fungus initially, then said eczema. Tried creams-no help. Uses makeup on feet when she wears sandals.     Review of Systems  Gastrointestinal: Positive for abdominal distention.  Musculoskeletal: Positive for back pain.  Skin: Positive for color change.       Change in nails Thick scars   All other systems reviewed and are negative.      Objective:   Physical Exam        Assessment & Plan:

## 2014-01-02 NOTE — Progress Notes (Signed)
Subjective:     Patient ID: [name], female   DOB: August 02, 1966, 48 y.o.   MRN: [id number]  HPI patient states that she's had skin condition for a long time on both of her arches in her big toe joints and she's been to numerous doctors with no when knowing what to do   Review of Systems  All other systems reviewed and are negative.      Objective:   Physical Exam  Nursing note and vitals reviewed. Constitutional: She is oriented to person, place, and time.  Cardiovascular: Intact distal pulses.   Musculoskeletal: Normal range of motion.  Neurological: She is oriented to person, place, and time.  Skin: Skin is warm.   neurovascular status intact with discoloration on the arches of both feet but probably some kind of a dermatological condition and white discoloration on the first metatarsal head both feet were there is slight bone prominence     Assessment:     Probable eczema that is occurring with a genetic skin condition    Plan:     Advised that I have no treatment for this and I do not think it will be treatable but I advised her to go to [facility] to see if there is anything they can do to help her condition

## 2014-02-04 ENCOUNTER — Other Ambulatory Visit (HOSPITAL_BASED_OUTPATIENT_CLINIC_OR_DEPARTMENT_OTHER): Payer: Self-pay | Admitting: *Deleted

## 2014-02-04 ENCOUNTER — Ambulatory Visit (HOSPITAL_BASED_OUTPATIENT_CLINIC_OR_DEPARTMENT_OTHER)
Admission: RE | Admit: 2014-02-04 | Discharge: 2014-02-04 | Disposition: A | Discharge status: Home / Self Care | Payer: BC Managed Care – PPO | Source: Ambulatory Visit | Attending: Physician Assistant | Admitting: Physician Assistant

## 2014-02-04 DIAGNOSIS — IMO0002 Reserved for concepts with insufficient information to code with codable children: Secondary | ICD-10-CM

## 2014-02-04 DIAGNOSIS — M5126 Other intervertebral disc displacement, lumbar region: Secondary | ICD-10-CM

## 2014-02-04 DIAGNOSIS — M545 Low back pain, unspecified: Secondary | ICD-10-CM | POA: Insufficient documentation

## 2014-02-04 DIAGNOSIS — M5137 Other intervertebral disc degeneration, lumbosacral region: Secondary | ICD-10-CM

## 2014-04-18 ENCOUNTER — Other Ambulatory Visit: Payer: Self-pay

## 2014-04-19 LAB — CYTOLOGY - PAP

## 2014-11-25 ENCOUNTER — Encounter: Payer: Self-pay | Admitting: Gastroenterology

## 2015-03-24 ENCOUNTER — Telehealth: Payer: Self-pay

## 2015-03-24 NOTE — Telephone Encounter (Signed)
Left message for pt to call back. Called pt because she has not been seen here. Maybe she called the wrong office.

## 2015-03-24 NOTE — Telephone Encounter (Signed)
Can we refer? 

## 2015-03-24 NOTE — Telephone Encounter (Signed)
Patient is calling because she would like a referral to see Dr. Bing Plume or Dr. Lorene Dy for an eye exam exams. Patient is hoping to hear something back today. Patient phone: [phone]

## 2015-03-24 NOTE — Telephone Encounter (Signed)
Spoke with pt, advised pt she was never seen here. Pt will come in to see Dr. Tamala Julian because that's who is on her [facility] compass card. Pt will come in tomorrow

## 2015-03-25 ENCOUNTER — Ambulatory Visit (INDEPENDENT_AMBULATORY_CARE_PROVIDER_SITE_OTHER): Payer: 59 | Admitting: Family Medicine

## 2015-03-25 VITALS — BP 100/80 | HR 64 | Temp 98.5°F | Resp 18 | Ht 66.0 in | Wt 156.1 lb

## 2015-03-25 DIAGNOSIS — Z1211 Encounter for screening for malignant neoplasm of colon: Secondary | ICD-10-CM | POA: Diagnosis not present

## 2015-03-25 DIAGNOSIS — N959 Unspecified menopausal and perimenopausal disorder: Secondary | ICD-10-CM

## 2015-03-25 DIAGNOSIS — L309 Dermatitis, unspecified: Secondary | ICD-10-CM

## 2015-03-25 DIAGNOSIS — M51369 Other intervertebral disc degeneration, lumbar region without mention of lumbar back pain or lower extremity pain: Secondary | ICD-10-CM

## 2015-03-25 DIAGNOSIS — H5213 Myopia, bilateral: Secondary | ICD-10-CM | POA: Diagnosis not present

## 2015-03-25 DIAGNOSIS — M5136 Other intervertebral disc degeneration, lumbar region: Secondary | ICD-10-CM

## 2015-03-25 NOTE — Progress Notes (Signed)
Patient ID: [name], female   DOB: 1966/06/01, 49 y.o.   MRN: [id number]   Subjective:  This chart was scribed for Jill Forts, MD by [facility], medical scribe at [facility].The patient was seen in exam room 01 and the patient's care was started at 7:26 PM.   Patient ID: [name], female    DOB: June 21, 1966, 49 y.o.   MRN: [id number]  03/25/2015  Referral  HPI HPI Comments: [name] is a 49 y.o. female who presents to [facility] to establish care and  for a referral to Dr. Lowella Dell, Dr. Caprice Beaver, and Dr. Delene Loll. [facility] and assigned here for PCP.  Health Maintenance: Last physical, mammogram, and pap smear last year 2015 by her ObGYN. Dr. Fuller Plan called her for a colonoscopy, did have some abdominal pain last year and had and endoscopy. She has not had this pain since. No known allergies, and takes no prescription medication.  Family History: Mother is 78, has diabetes, breast cancer at 78. Father is 34, has diabetes, neither have had heart attacks One sister, two brothers all healthy.  Social History: Married for 38 years, son 12 years old one granddaughter who is 4. She lives with her husband only. Works as a Merchant navy officer, also an Statistician. Never smoked. Exercise three times week, weight lifting, and cardio.  Immunization: Last tetanus this year, flu shot this year  Opthalmology:  Sees a doctor yearly. She wears one contact in her right eye. No glaumoma or cataracts. Needs a referral to Dr. Delene Loll  OBGYN: Followed by Dr. Lowella Dell, having irregular menstrual periods.  Dentist: Has not seen a dentist in several years.  Arthritis in her lower back: Followed by Dr. Caprice Beaver, in [place], [place] at [facility].  Eczema/rash: Would like a referral to a doctor in [place] to treat this. Uses coconut lotion to treat this. Pt has had eczema since she was in the second grade. Given eucerin for no  relief.  Review of Systems  Constitutional: Negative for fever, chills, diaphoresis and fatigue.  HENT: Negative for ear pain, postnasal drip, rhinorrhea, sinus pressure, sore throat and trouble swallowing.   Eyes: Positive for visual disturbance.  Respiratory: Negative for cough and shortness of breath.   Cardiovascular: Negative for chest pain, palpitations and leg swelling.  Gastrointestinal: Negative for nausea, vomiting, abdominal pain, diarrhea and constipation.  Endocrine: Negative for cold intolerance, heat intolerance, polydipsia, polyphagia and polyuria.  Genitourinary: Positive for menstrual problem.  Skin: Positive for rash.  Neurological: Negative for dizziness, tremors, seizures, syncope, facial asymmetry, speech difficulty, weakness, light-headedness, numbness and headaches.    Past Medical History  Diagnosis Date  . Arthritis     DDD lumbar spine mild  . Eczema   . Allergy    Past Surgical History  Procedure Laterality Date  . Endometrial ablation    . Tubal ligation    . Tubal ligation      Reversal  . Kidney donation      2007   No Known Allergies Current Outpatient Prescriptions  Medication Sig Dispense Refill  . BEE POLLEN PO Take by mouth.    . Multiple Vitamin (MULTIVITAMIN) capsule Take 1 capsule by mouth daily.     No current facility-administered medications for this visit.   Social History   Social History  . Marital Status: Married    Spouse Name: N/A  . Number of Children: 1  .  Years of Education: N/A   Occupational History  . Bondsman   . Lifestyle Model    Social History Main Topics  . Smoking status: Never Smoker   . Smokeless tobacco: Never Used  . Alcohol Use: No  . Drug Use: No  . Sexual Activity: Not on file   Other Topics Concern  . Not on file   Social History Narrative   Marital status: married x 16 years      Children: one son; 1 granddaughter (67yo)      Lives: with husband      Employment:  Employed; self  employed Merchant navy officer; Conservator, museum/gallery and modeling      Tobacco; none      Alcohol:  None      Drugs: none      Exercise: weightlifting; elliptical at [employer]; husband is Fish farm manager; 3 days per week.   Attended business college   Occupation - bondsman, model   8 hours of sleep a night   3 residing in the home   Family History  Problem Relation Age of Onset  . Breast cancer Mother   . Diabetes Mother   . Cancer Mother 28    Breast cancer  . Breast cancer Maternal Aunt   . Diabetes Father   . Diabetes Maternal Grandfather        Objective:    BP 100/80 mmHg  Pulse 64  Temp(Src) 98.5 F (36.9 C) (Oral)  Resp 18  Ht 5\' 6"  (1.676 m)  Wt 156 lb 2 oz (70.818 kg)  BMI 25.21 kg/m2  SpO2 96% Physical Exam  Constitutional: She is oriented to person, place, and time. She appears well-developed and well-nourished. No distress.  HENT:  Head: Normocephalic and atraumatic.  Right Ear: External ear normal.  Left Ear: External ear normal.  Nose: Nose normal.  Mouth/Throat: Oropharynx is clear and moist.  Eyes: Conjunctivae and EOM are normal. Pupils are equal, round, and reactive to light.  Neck: Normal range of motion. Neck supple. Carotid bruit is not present. No thyromegaly present.  Cardiovascular: Normal rate, regular rhythm, normal heart sounds and intact distal pulses.  Exam reveals no gallop and no friction rub.   No murmur heard. Pulmonary/Chest: Effort normal and breath sounds normal. She has no wheezes. She has no rales.  Abdominal: Soft. Bowel sounds are normal. She exhibits no distension and no mass. There is no tenderness. There is no rebound and no guarding.  Lymphadenopathy:    She has no cervical adenopathy.  Neurological: She is alert and oriented to person, place, and time. No cranial nerve deficit.  Skin: Skin is warm and dry. No rash noted. She is not diaphoretic. No erythema. No pallor.  Psychiatric: She has a normal mood and affect. Her behavior is  normal.   Results for orders placed or performed in visit on 04/18/14  Cytology - PAP  Result Value Ref Range   CYTOLOGY - PAP PAP RESULT       Assessment & Plan:  DDD (degenerative disc disease), lumbar - Plan: Ambulatory referral to Neurosurgery  Perimenopausal disorder - Plan: Ambulatory referral to Gynecology  Near sighted, bilateral - Plan: Ambulatory referral to Ophthalmology  Eczema  Colon cancer screening - Plan: Ambulatory referral to Gastroenterology   1. DDD lumbar spine: stable; followed by NS; referral to NS. 2.  Perimenopausal state: stable; refer to [facility] for ongoing care. 3.  Nearsighted B: stable; refer to ophthalmology for ongoing care. 4. Eczema: stable; recommend establishing  with dermatology in [place]. 5.  Colon cancer screening: refer back to GI.   No orders of the defined types were placed in this encounter.    No Follow-up on file.  I personally performed the services described in this documentation, which was scribed in my presence. The recorded information has been reviewed and considered.  Evoleth Nordmeyer Elayne Guerin, M.D. [facility] [address] [place], [place]  [address] [phone] phone [phone] fax

## 2015-03-26 ENCOUNTER — Encounter: Payer: Self-pay | Admitting: Gastroenterology

## 2015-04-16 ENCOUNTER — Telehealth: Payer: Self-pay

## 2015-04-16 NOTE — Telephone Encounter (Signed)
Spoke with Pam at [employer] to [employer] coverage for the patient in order to complete the patient's referrals.  She said the patient's insurance is terminated because premium payments have not been made.  Her last bill was due on 02/20/15 with a grace period of 6/31/16.  They have not received a payment since 01/27/15.  The pending referrals will be considered self pay unless her insurance coverage is re-established.  Reference number for call: [phone].

## 2015-06-11 ENCOUNTER — Encounter: Payer: 59 | Admitting: Gastroenterology

## 2015-06-24 ENCOUNTER — Encounter: Payer: Self-pay | Admitting: Gastroenterology

## 2015-09-12 ENCOUNTER — Encounter: Payer: 59 | Admitting: Gastroenterology

## 2015-11-04 ENCOUNTER — Ambulatory Visit (AMBULATORY_SURGERY_CENTER): Payer: Self-pay

## 2015-11-04 VITALS — Ht 68.0 in | Wt 169.4 lb

## 2015-11-04 DIAGNOSIS — Z1211 Encounter for screening for malignant neoplasm of colon: Secondary | ICD-10-CM

## 2015-11-04 MED ORDER — NA SULFATE-K SULFATE-MG SULF 17.5-3.13-1.6 GM/177ML PO SOLN: ORAL | Status: DC

## 2015-11-04 NOTE — Progress Notes (Signed)
Per pt, no allergies to soy or egg products.Pt not taking any weight loss meds or using  O2 at home.  Pt came into the office today for her pre-visit prior to her colonoscopy with Dr Fuller Plan on 11/18/15.Pt states she has never had a colonoscopy done before. No report was found!

## 2015-11-12 ENCOUNTER — Telehealth: Payer: Self-pay | Admitting: Gastroenterology

## 2015-11-12 NOTE — Telephone Encounter (Signed)
Called pt LM, would leave prep sample at front desk on 4th floor and she could come by and pick it up anytime tomorrow, if she has any other questions to please call-adm

## 2015-11-18 ENCOUNTER — Ambulatory Visit (AMBULATORY_SURGERY_CENTER): Payer: BLUE CROSS/BLUE SHIELD | Admitting: Gastroenterology

## 2015-11-18 ENCOUNTER — Encounter: Payer: Self-pay | Admitting: Gastroenterology

## 2015-11-18 VITALS — BP 116/80 | HR 61 | Temp 97.1°F | Resp 16 | Ht 66.0 in | Wt 156.0 lb

## 2015-11-18 DIAGNOSIS — Z1211 Encounter for screening for malignant neoplasm of colon: Secondary | ICD-10-CM

## 2015-11-18 DIAGNOSIS — K635 Polyp of colon: Secondary | ICD-10-CM | POA: Diagnosis not present

## 2015-11-18 DIAGNOSIS — D125 Benign neoplasm of sigmoid colon: Secondary | ICD-10-CM | POA: Diagnosis not present

## 2015-11-18 MED ORDER — SODIUM CHLORIDE 0.9 % IV SOLN: 500.0000 mL | INTRAVENOUS | Status: DC

## 2015-11-18 NOTE — Patient Instructions (Signed)
YOU HAD AN ENDOSCOPIC PROCEDURE TODAY AT THE [facility]:   Refer to the procedure report that was given to you for any specific questions about what was found during the examination.  If the procedure report does not answer your questions, please call your gastroenterologist to clarify.  If you requested that your care partner not be given the details of your procedure findings, then the procedure report has been included in a sealed envelope for you to review at your convenience later.  YOU SHOULD EXPECT: Some feelings of bloating in the abdomen. Passage of more gas than usual.  Walking can help get rid of the air that was put into your GI tract during the procedure and reduce the bloating. If you had a lower endoscopy (such as a colonoscopy or flexible sigmoidoscopy) you may notice spotting of blood in your stool or on the toilet paper. If you underwent a bowel prep for your procedure, you may not have a normal bowel movement for a few days.  Please Note:  You might notice some irritation and congestion in your nose or some drainage.  This is from the oxygen used during your procedure.  There is no need for concern and it should clear up in a day or so.  SYMPTOMS TO REPORT IMMEDIATELY:   Following lower endoscopy (colonoscopy or flexible sigmoidoscopy):  Excessive amounts of blood in the stool  Significant tenderness or worsening of abdominal pains  Swelling of the abdomen that is new, acute  Fever of 100F or higher  For urgent or emergent issues, a gastroenterologist can be reached at any hour by calling [phone].   DIET: Your first meal following the procedure should be a small meal and then it is ok to progress to your normal diet. Heavy or fried foods are harder to digest and may make you feel nauseous or bloated.  Likewise, meals heavy in dairy and vegetables can increase bloating.  Drink plenty of fluids but you should avoid alcoholic beverages for 24  hours.  ACTIVITY:  You should plan to take it easy for the rest of today and you should NOT DRIVE or use heavy machinery until tomorrow (because of the sedation medicines used during the test).    FOLLOW UP: Our staff will call the number listed on your records the next business day following your procedure to check on you and address any questions or concerns that you may have regarding the information given to you following your procedure. If we do not reach you, we will leave a message.  However, if you are feeling well and you are not experiencing any problems, there is no need to return our call.  We will assume that you have returned to your regular daily activities without incident.  If any biopsies were taken you will be contacted by phone or by letter within the next 1-3 weeks.  Please call us at [phone] if you have not heard about the biopsies in 3 weeks.    SIGNATURES/CONFIDENTIALITY: You and/or your care partner have signed paperwork which will be entered into your electronic medical record.  These signatures attest to the fact that that the information above on your After Visit Summary has been reviewed and is understood.  Full responsibility of the confidentiality of this discharge information lies with you and/or your care-partner.  Please review polyp handout provided. Next colonoscopy determined by pathology results.  

## 2015-11-18 NOTE — Progress Notes (Signed)
To PACU pt awake and alert report to RN

## 2015-11-18 NOTE — Op Note (Signed)
[facility] Patient Name: [name] Procedure Date: 11/18/2015 10:10 AM MRN: [id number] Endoscopist: Ladene Artist , MD Age: 50 Referring MD:  Date of Birth: 04-24-1966 Gender: Female Procedure:                Colonoscopy Indications:              Screening for colorectal malignant neoplasm Medicines:                Monitored Anesthesia Care Procedure:                Pre-Anesthesia Assessment:                           - Prior to the procedure, a History and Physical                            was performed, and patient medications and                            allergies were reviewed. The patient's tolerance of                            previous anesthesia was also reviewed. The risks                            and benefits of the procedure and the sedation                            options and risks were discussed with the patient.                            All questions were answered, and informed consent                            was obtained. Prior Anticoagulants: The patient has                            taken no previous anticoagulant or antiplatelet                            agents. ASA Grade Assessment: II - A patient with                            mild systemic disease. After reviewing the risks                            and benefits, the patient was deemed in                            satisfactory condition to undergo the procedure.                           After obtaining informed consent, the colonoscope  was passed under direct vision. Throughout the                            procedure, the patient's blood pressure, pulse, and                            oxygen saturations were monitored continuously. The                            Model PCF-H190L [id number]) scope was introduced                            through the anus and advanced to the the cecum,                            identified by appendiceal orifice and  ileocecal                            valve. The colonoscopy was performed without                            difficulty. The patient tolerated the procedure                            well. The quality of the bowel preparation was                            excellent. The ileocecal valve, appendiceal                            orifice, and rectum were photographed. Scope In: 10:29:40 AM Scope Out: 10:42:49 AM Scope Withdrawal Time: 0 hours 11 minutes 27 seconds  Total Procedure Duration: 0 hours 13 minutes 9 seconds  Findings:      The digital rectal exam was normal.      Two sessile polyps were found in the sigmoid colon. The polyps were 4 to       5 mm in size. These polyps were removed with a cold biopsy forceps.       Resection and retrieval were complete.      The exam was otherwise without abnormality on direct and retroflexion       views. Complications:            No immediate complications. Estimated Blood Loss:     Estimated blood loss was minimal. Impression:               - Two 4 to 5 mm polyps in the sigmoid colon,                            removed with a cold biopsy forceps. Resected and                            retrieved.                           - The examination was otherwise  normal on direct                            and retroflexion views. Recommendation:           - Patient has a contact number available for                            emergencies. The signs and symptoms of potential                            delayed complications were discussed with the                            patient. Return to normal activities tomorrow.                            Written discharge instructions were provided to the                            patient.                           - Resume previous diet.                           - Continue present medications.                           - Await pathology results.                           - Repeat colonoscopy is recommended.  The                            colonoscopy date will be determined after pathology                            results from today's exam become available for                            review. Procedure Code(s):        --- Professional ---                           [phone], Colonoscopy, flexible; with biopsy, single                            or multiple CPT copyright 2016 American Medical Association. All rights reserved. Ladene Artist, MD 11/18/2015 10:49:35 AM This report has been signed electronically. Number of Addenda: 0 Referring MD:      Reginia Forts, MD

## 2015-11-18 NOTE — Progress Notes (Signed)
Called to room to assist during endoscopic procedure.  Patient ID and intended procedure confirmed with present staff. Received instructions for my participation in the procedure from the performing physician.  

## 2015-11-19 ENCOUNTER — Telehealth: Payer: Self-pay

## 2015-11-19 NOTE — Telephone Encounter (Signed)
Attempt post procedure call back. No answer, left voice mail message.

## 2015-11-28 ENCOUNTER — Encounter: Payer: Self-pay | Admitting: Gastroenterology

## 2016-12-15 ENCOUNTER — Ambulatory Visit: Payer: BLUE CROSS/BLUE SHIELD | Admitting: Gastroenterology

## 2017-02-22 ENCOUNTER — Encounter: Payer: Self-pay | Admitting: Physician Assistant

## 2017-02-22 ENCOUNTER — Ambulatory Visit (INDEPENDENT_AMBULATORY_CARE_PROVIDER_SITE_OTHER): Payer: BLUE CROSS/BLUE SHIELD | Admitting: Physician Assistant

## 2017-02-22 VITALS — BP 103/69 | HR 52 | Temp 98.1°F | Resp 18 | Ht 66.93 in | Wt 164.4 lb

## 2017-02-22 DIAGNOSIS — N898 Other specified noninflammatory disorders of vagina: Secondary | ICD-10-CM

## 2017-02-22 DIAGNOSIS — N3001 Acute cystitis with hematuria: Secondary | ICD-10-CM | POA: Diagnosis not present

## 2017-02-22 DIAGNOSIS — B373 Candidiasis of vulva and vagina: Secondary | ICD-10-CM

## 2017-02-22 DIAGNOSIS — B3731 Acute candidiasis of vulva and vagina: Secondary | ICD-10-CM

## 2017-02-22 LAB — POCT WET + KOH PREP: Trich by wet prep: ABSENT

## 2017-02-22 LAB — POCT URINALYSIS DIP (MANUAL ENTRY)
Bilirubin, UA: NEGATIVE
Glucose, UA: NEGATIVE mg/dL
Ketones, POC UA: NEGATIVE mg/dL
Nitrite, UA: POSITIVE — AB
PROTEIN UA: NEGATIVE mg/dL
SPEC GRAV UA: 1.02 (ref 1.010–1.025)
UROBILINOGEN UA: 0.2 U/dL
pH, UA: 6 (ref 5.0–8.0)

## 2017-02-22 LAB — POCT URINE PREGNANCY: PREG TEST UR: NEGATIVE

## 2017-02-22 MED ORDER — FLUCONAZOLE 150 MG PO TABS: 150.0000 mg | ORAL_TABLET | Freq: Once | ORAL | 0 refills | Status: AC

## 2017-02-22 MED ORDER — CIPROFLOXACIN HCL 500 MG PO TABS: 500.0000 mg | ORAL_TABLET | Freq: Two times a day (BID) | ORAL | 0 refills | Status: AC

## 2017-02-22 NOTE — Progress Notes (Signed)
02/22/2017 9:12 AM   DOB: Mar 18, 1966 / MRN: [id number]  SUBJECTIVE:  [name] is a 51 y.o. female presenting for vaginal burning and itching that started about two days ago.  Has tried some natural remedies at home and this is helping some.  She is very stressed out. She in a monogamous with her husband of 27.  She would like a pelvic exam today. She is status post tubal ligation reversal.    She has No Known Allergies.   She  has a past medical history of Allergy; Arthritis; and Eczema.    She  reports that she has never smoked. She has never used smokeless tobacco. She reports that she does not drink alcohol or use drugs. She  reports that she currently engages in sexual activity. The patient  has a past surgical history that includes Endometrial ablation; Tubal ligation; Tubal ligation; and Kidney donation.  Her family history includes Breast cancer in her maternal aunt and mother; Cancer (age of onset: 83) in her mother; Diabetes in her father, maternal grandfather, and mother; Heart disease in her maternal aunt.  Review of Systems  Constitutional: Negative for chills and fever.  Gastrointestinal: Negative for heartburn and nausea.  Genitourinary: Negative for dysuria, flank pain, frequency, hematuria and urgency.  Skin: Negative for itching and rash.  Neurological: Negative for dizziness.    The problem list and medications were reviewed and updated by myself where necessary and exist elsewhere in the encounter.   OBJECTIVE:  BP 103/69 (BP Location: Right Arm, Patient Position: Sitting, Cuff Size: Normal)   Pulse (!) 52   Temp 98.1 F (36.7 C) (Oral)   Resp 18   Ht 5' 6.93" (1.7 m)   Wt 164 lb 6.4 oz (74.6 kg)   LMP 01/04/2017 (Approximate)   SpO2 98%   BMI 25.80 kg/m   Physical Exam  Constitutional: She is oriented to person, place, and time. She appears well-developed and well-nourished.  Cardiovascular: Normal rate and regular rhythm.   Pulmonary/Chest: Effort  normal and breath sounds normal.  Genitourinary: There is no rash, tenderness, lesion or injury on the right labia. There is no rash, tenderness, lesion or injury on the left labia. No erythema, tenderness or bleeding in the vagina. No foreign body in the vagina. No signs of injury around the vagina. Vaginal discharge (whitish) found.  Musculoskeletal: Normal range of motion.  Neurological: She is alert and oriented to person, place, and time.  Skin: Skin is warm and dry.    Results for orders placed or performed in visit on 02/22/17 (from the past 72 hour(s))  POCT urine pregnancy     Status: None   Collection Time: 02/22/17  9:05 AM  Result Value Ref Range   Preg Test, Ur Negative Negative  POCT urinalysis dipstick     Status: Abnormal   Collection Time: 02/22/17  9:05 AM  Result Value Ref Range   Color, UA yellow yellow   Clarity, UA cloudy (A) clear   Glucose, UA negative negative mg/dL   Bilirubin, UA negative negative   Ketones, POC UA negative negative mg/dL   Spec Grav, UA 1.020 1.010 - 1.025   Blood, UA moderate (A) negative   pH, UA 6.0 5.0 - 8.0   Protein Ur, POC negative negative mg/dL   Urobilinogen, UA 0.2 0.2 or 1.0 E.U./dL   Nitrite, UA Positive (A) Negative   Leukocytes, UA Large (3+) (A) Negative  POCT Wet + KOH Prep  Status: Abnormal   Collection Time: 02/22/17  9:10 AM  Result Value Ref Range   Yeast by KOH Present (A) Absent   Yeast by wet prep Present (A) Absent   WBC by wet prep Few Few   Clue Cells Wet Prep HPF POC None None   Trich by wet prep Absent Absent   Bacteria Wet Prep HPF POC Many (A) Few   Epithelial Cells By [employer] Pref ([facility]) Moderate (A) None, Few, Too numerous to count   RBC,UR,HPF,POC None None RBC/hpf    No results found.  ASSESSMENT AND PLAN:  [name] was seen today for vaginal discharge and vaginal itching.  Diagnoses and all orders for this visit:  Vaginal irritation -     POCT urine pregnancy -     POCT urinalysis  dipstick -     POCT Wet + KOH Prep -     Care order/instruction:  Yeast vaginitis -     fluconazole (DIFLUCAN) 150 MG tablet; Take 1 tablet (150 mg total) by mouth once. Repeat if needed  Acute cystitis with hematuria -     ciprofloxacin (CIPRO) 500 MG tablet; Take 1 tablet (500 mg total) by mouth 2 (two) times daily. -     Urine Culture    The patient is advised to call or return to clinic if she does not see an improvement in symptoms, or to seek the care of the closest emergency department if she worsens with the above plan.   Philis Fendt, MHS, PA-C [facility] 02/22/2017 9:12 AM

## 2017-02-22 NOTE — Patient Instructions (Signed)
     IF you received an x-ray today, you will receive an invoice from [facility]. Please contact [facility] at [phone] with questions or concerns regarding your invoice.   IF you received labwork today, you will receive an invoice from [facility]. Please contact [facility] at [phone] with questions or concerns regarding your invoice.   Our billing staff will not be able to assist you with questions regarding bills from these companies.  You will be contacted with the lab results as soon as they are available. The fastest way to get your results is to activate your My Chart account. Instructions are located on the last page of this paperwork. If you have not heard from us regarding the results in 2 weeks, please contact this office.     

## 2017-02-24 LAB — URINE CULTURE

## 2017-03-01 ENCOUNTER — Encounter (HOSPITAL_COMMUNITY): Payer: Self-pay

## 2017-03-01 ENCOUNTER — Emergency Department (HOSPITAL_COMMUNITY)
Admission: EM | Admit: 2017-03-01 | Discharge: 2017-03-02 | Disposition: A | Discharge status: Home / Self Care | Payer: BLUE CROSS/BLUE SHIELD | Attending: Emergency Medicine | Admitting: Emergency Medicine

## 2017-03-01 DIAGNOSIS — R109 Unspecified abdominal pain: Secondary | ICD-10-CM | POA: Diagnosis present

## 2017-03-01 DIAGNOSIS — Z5321 Procedure and treatment not carried out due to patient leaving prior to being seen by health care provider: Secondary | ICD-10-CM | POA: Insufficient documentation

## 2017-03-01 NOTE — ED Triage Notes (Signed)
Pt complains of severe abd pain and cramping this evening Pt has had this before and it goes away Pt denies and vomiting or diarrhea

## 2017-03-02 ENCOUNTER — Encounter: Payer: Self-pay | Admitting: Physician Assistant

## 2017-03-02 ENCOUNTER — Ambulatory Visit (INDEPENDENT_AMBULATORY_CARE_PROVIDER_SITE_OTHER): Payer: BLUE CROSS/BLUE SHIELD | Admitting: Physician Assistant

## 2017-03-02 ENCOUNTER — Ambulatory Visit (INDEPENDENT_AMBULATORY_CARE_PROVIDER_SITE_OTHER): Payer: BLUE CROSS/BLUE SHIELD

## 2017-03-02 VITALS — BP 105/61 | HR 52 | Temp 98.0°F | Resp 16 | Ht 67.5 in | Wt 163.0 lb

## 2017-03-02 DIAGNOSIS — R1012 Left upper quadrant pain: Secondary | ICD-10-CM

## 2017-03-02 LAB — POCT URINE PREGNANCY: PREG TEST UR: NEGATIVE

## 2017-03-02 NOTE — Patient Instructions (Addendum)
Try OTC simethicone with meals and before bed to reduce the gas. Drink 64 ounces of water daily. Make sure that you are getting plenty of fiber in your diet.    IF you received an x-ray today, you will receive an invoice from [facility]. Please contact [facility] at [phone] with questions or concerns regarding your invoice.   IF you received labwork today, you will receive an invoice from [facility]. Please contact [facility] at [phone] with questions or concerns regarding your invoice.   Our billing staff will not be able to assist you with questions regarding bills from these companies.  You will be contacted with the lab results as soon as they are available. The fastest way to get your results is to activate your My Chart account. Instructions are located on the last page of this paperwork. If you have not heard from [place] regarding the results in 2 weeks, please contact this office.

## 2017-03-02 NOTE — ED Triage Notes (Signed)
Pt was walking out of triage when she was told that a room was being cleaned in the back and the patient just put her hand up and kept walking out of the ED
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
## 2018-07-26 ENCOUNTER — Ambulatory Visit: Payer: BLUE CROSS/BLUE SHIELD | Admitting: Physician Assistant

## 2018-08-03 ENCOUNTER — Encounter: Payer: Self-pay | Admitting: Emergency Medicine

## 2018-08-03 ENCOUNTER — Other Ambulatory Visit: Payer: Self-pay

## 2018-08-03 ENCOUNTER — Encounter

## 2018-08-03 ENCOUNTER — Ambulatory Visit: Payer: BLUE CROSS/BLUE SHIELD | Admitting: Emergency Medicine

## 2018-08-03 VITALS — BP 112/77 | HR 54 | Temp 98.6°F | Resp 16 | Ht 67.0 in | Wt 183.0 lb

## 2018-08-03 DIAGNOSIS — H9313 Tinnitus, bilateral: Secondary | ICD-10-CM | POA: Insufficient documentation

## 2018-08-03 DIAGNOSIS — F0781 Postconcussional syndrome: Secondary | ICD-10-CM | POA: Diagnosis not present

## 2018-08-03 DIAGNOSIS — G44311 Acute post-traumatic headache, intractable: Secondary | ICD-10-CM | POA: Diagnosis not present
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
## 2018-08-03 NOTE — Patient Instructions (Addendum)
If you have lab work done today you will be contacted with your lab results within the next 2 weeks.  If you have not heard from [place] then please contact us. The fastest way to get your results is to register for My Chart.   IF you received an x-ray today, you will receive an invoice from [facility]. Please contact [facility] at [phone] with questions or concerns regarding your invoice.   IF you received labwork today, you will receive an invoice from [facility]. Please contact [facility] at [phone] with questions or concerns regarding your invoice.   Our billing staff will not be able to assist you with questions regarding bills from these companies.  You will be contacted with the lab results as soon as they are available. The fastest way to get your results is to activate your My Chart account. Instructions are located on the last page of this paperwork. If you have not heard from [place] regarding the results in 2 weeks, please contact this office.     Concussion, Adult A concussion is a brain injury from a direct hit (blow) to the head or body. This injury causes the brain to shake quickly back and forth inside the skull. It is caused by:  A hit to the head.  A quick and sudden movement (jolt) of the head or neck.  How fast you will get better from a concussion depends on many things like how bad your concussion was, what part of your brain was hurt, how old you are, and how healthy you were before the concussion. Recovery can take time. It is important to wait to return to activity until a doctor says it is safe and your symptoms are all gone. Follow these instructions at home: Activity  Limit activities that need a lot of thought or concentration. These include: ? Homework or work for your job. ? Watching TV. ? Computer work. ? Playing memory games and puzzles.  Rest. Rest helps the brain to heal. Make sure you: ? Get plenty of sleep at night. Do not  stay up late. ? Go to bed at the same time every day. ? Rest during the day. Take naps or rest breaks when you feel tired.  It can be dangerous if you get another concussion before the first one has healed Do not do activities that could cause a second concussion, such as riding a bike or playing sports.  Ask your doctor when you can return to your normal activities, like driving, riding a bike, or using machinery. Your ability to react may be slower. Do not do these activities if you are dizzy. Your doctor will likely give you a plan for slowly going back to activities. General instructions  Take over-the-counter and prescription medicines only as told by your doctor.  Do not drink alcohol until your doctor says you can.  If it is harder than usual to remember things, write them down.  If you are easily distracted, try to do one thing at a time. For example, do not try to watch TV while making dinner.  Talk with family members or close friends when you need to make important decisions.  Watch your symptoms and tell other people to do the same. Other problems (complications) can happen after a concussion. Older adults with a brain injury may have a higher risk of serious problems, such as a blood clot in the brain.  Tell your teachers, school nurse, school counselor, coach,  athletic trainer, or work Freight forwarder about your injury and symptoms. Tell them about what you can or cannot do. They should watch for: ? More problems with attention or concentration. ? More trouble remembering or learning new information. ? More time needed to do tasks or assignments. ? Being more annoyed (irritable) or having a harder time dealing with stress. ? Any other symptoms that get worse.  Keep all follow-up visits as told by your health care provider. This is important. Prevention  It is very important that you donot get another brain injury, especially before you have healed. In rare cases, another injury  can cause permanent brain damage, brain swelling, or death. You have the most risk if you get another head injury in the first 7-10 days after you were hurt before. To avoid injuries: ? Wear a seat belt when you ride in a car. ? Do not drink too much alcohol. ? Avoid activities that could make you get a second concussion, like contact sports. ? Wear a helmet when you do activities like:  Biking.  Skiing.  Skateboarding.  Skating. ? Make your home safe by:  Removing things from the floor or stairs that could make you trip.  Using grab bars in bathrooms and handrails by stairs.  Placing non-slip mats on floors and in bathtubs.  Putting more light in dark areas. Contact a doctor if:  Your symptoms get worse.  You have new symptoms.  You keep having symptoms for more than 2 weeks. Get help right away if:  You have bad headaches, or your headaches get worse.  You have weakness in any part of your body.  You have loss of feeling (numbness).  You feel off balance.  You keep throwing up (vomiting).  You feel more sleepy.  The black center of one eye (pupil) is bigger than the other one.  You twitch or shake violently (convulse) or have a seizure.  Your speech is not clear (is slurred).  You feel more tired, more confused, or more annoyed.  You do not recognize people or places.  You have neck pain.  It is hard to wake you up.  You have strange behavior changes.  You pass out (lose consciousness). Summary  A concussion is a brain injury from a direct hit (blow) to the head or body.  This condition is treated with rest and careful watching of symptoms.  If you keep having symptoms for more than 2 weeks, call your doctor. This information is not intended to replace advice given to you by your health care provider. Make sure you discuss any questions you have with your health care provider. Document Released: 07/28/2009 Document Revised: 07/24/2016 Document  Reviewed: 07/24/2016 Elsevier Interactive Patient Education  2017 [employer].

## 2018-08-04 LAB — COMPREHENSIVE METABOLIC PANEL
ALT: 6 IU/L (ref 0–32)
AST: 14 IU/L (ref 0–40)
Albumin/Globulin Ratio: 1.7 (ref 1.2–2.2)
Albumin: 4.8 g/dL (ref 3.5–5.5)
Alkaline Phosphatase: 66 IU/L (ref 39–117)
BILIRUBIN TOTAL: 0.5 mg/dL (ref 0.0–1.2)
BUN/Creatinine Ratio: 13 (ref 9–23)
BUN: 12 mg/dL (ref 6–24)
CALCIUM: 9.5 mg/dL (ref 8.7–10.2)
CHLORIDE: 102 mmol/L (ref 96–106)
CO2: 20 mmol/L (ref 20–29)
Creatinine, Ser: 0.92 mg/dL (ref 0.57–1.00)
GFR, EST AFRICAN AMERICAN: 82 mL/min/{1.73_m2} (ref >59)
GFR, EST NON AFRICAN AMERICAN: 71 mL/min/{1.73_m2} (ref >59)
GLUCOSE: 74 mg/dL (ref 65–99)
Globulin, Total: 2.9 g/dL (ref 1.5–4.5)
Potassium: 4.3 mmol/L (ref 3.5–5.2)
Sodium: 140 mmol/L (ref 134–144)
TOTAL PROTEIN: 7.7 g/dL (ref 6.0–8.5)

## 2018-08-04 LAB — CBC WITH DIFFERENTIAL/PLATELET
BASOS ABS: 0 10*3/uL (ref 0.0–0.2)
Basos: 1 %
EOS (ABSOLUTE): 0 10*3/uL (ref 0.0–0.4)
Eos: 1 %
Hematocrit: 43.2 % (ref 34.0–46.6)
Hemoglobin: 14.4 g/dL (ref 11.1–15.9)
IMMATURE GRANS (ABS): 0 10*3/uL (ref 0.0–0.1)
IMMATURE GRANULOCYTES: 0 %
LYMPHS: 35 %
Lymphocytes Absolute: 1.5 10*3/uL (ref 0.7–3.1)
MCH: 30.7 pg (ref 26.6–33.0)
MCHC: 33.3 g/dL (ref 31.5–35.7)
MCV: 92 fL (ref 79–97)
Monocytes Absolute: 0.3 10*3/uL (ref 0.1–0.9)
Monocytes: 7 %
NEUTROS PCT: 56 %
Neutrophils Absolute: 2.4 10*3/uL (ref 1.4–7.0)
PLATELETS: 285 10*3/uL (ref 150–450)
RBC: 4.69 x10E6/uL (ref 3.77–5.28)
RDW: 12.3 % (ref 12.3–15.4)
WBC: 4.2 10*3/uL (ref 3.4–10.8)

## 2018-08-22 ENCOUNTER — Ambulatory Visit
Admission: RE | Admit: 2018-08-22 | Discharge: 2018-08-22 | Disposition: A | Discharge status: Home / Self Care | Payer: BLUE CROSS/BLUE SHIELD | Source: Ambulatory Visit | Attending: Emergency Medicine | Admitting: Emergency Medicine

## 2018-08-22 ENCOUNTER — Other Ambulatory Visit: Payer: Self-pay | Admitting: Emergency Medicine

## 2018-08-22 DIAGNOSIS — F0781 Postconcussional syndrome: Secondary | ICD-10-CM

## 2018-08-22 DIAGNOSIS — G44311 Acute post-traumatic headache, intractable: Secondary | ICD-10-CM

## 2018-08-24 ENCOUNTER — Ambulatory Visit: Payer: Self-pay

## 2018-08-24 NOTE — Telephone Encounter (Signed)
Pt called to discuss her MRI result. Pt verbalized understanding. Pt informed a paper copy will be sent to home address.   Reason for Disposition . [1] Follow-up call to recent contact AND [2] information only call, no triage required  Answer Assessment - Initial Assessment Questions 1. REASON FOR CALL or QUESTION: "What is your reason for calling today?" or "How can I best help you?" or "What question do you have that I can help answer?"     Pt wanting information on head MRI.  Protocols used: INFORMATION ONLY CALL-A-AH

## 2018-09-26 ENCOUNTER — Telehealth: Payer: Self-pay | Admitting: Emergency Medicine

## 2018-09-26 ENCOUNTER — Encounter: Payer: Self-pay | Admitting: Emergency Medicine

## 2018-09-26 NOTE — Telephone Encounter (Signed)
Copied from [place] [phone]. Topic: General - Other >> Sep 26, 2018  1:16 PM Oneta Rack wrote: Relation to pt: self  Call back number: [phone]   Reason for call:  Patient insurance changed to [employer] is not in network. Patient  would like PCP to recommend a few neurologist and she can check with insurance if there in network, please advise

## 2018-09-27 ENCOUNTER — Ambulatory Visit: Payer: BLUE CROSS/BLUE SHIELD | Admitting: Neurology

## 2018-09-27 ENCOUNTER — Telehealth: Payer: Self-pay | Admitting: Emergency Medicine

## 2018-09-27 NOTE — Telephone Encounter (Signed)
SENT PATIENT TO [facility] PER HER INSURANCE 09/27/2018

## 2018-10-05 NOTE — Telephone Encounter (Signed)
I called pt with no answer and was unable to leave vm.

## 2018-10-10 NOTE — Telephone Encounter (Signed)
I called pt with no answer. With no vm
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
## 2020-06-30 ENCOUNTER — Encounter: Payer: Self-pay | Admitting: *Deleted
